# Patient Record
Sex: Female | Born: 1985
Health system: Southern US, Community
[De-identification: ages and names within clinical notes are randomized; demographics above are authoritative.]

## PROBLEM LIST (undated history)

## (undated) ENCOUNTER — Inpatient Hospital Stay (HOSPITAL_COMMUNITY): Admission: RE | Payer: 59 | Source: Ambulatory Visit

## (undated) DIAGNOSIS — E0500 Thyrotoxicosis with diffuse goiter without thyrotoxic crisis or storm: Secondary | ICD-10-CM

## (undated) DIAGNOSIS — E039 Hypothyroidism, unspecified: Secondary | ICD-10-CM

## (undated) HISTORY — PX: OTHER SURGICAL HISTORY: SHX169

## (undated) HISTORY — PX: LAPAROSCOPY: SHX197

## (undated) HISTORY — DX: Hypothyroidism, unspecified: E03.9

## (undated) HISTORY — PX: FACIAL COSMETIC SURGERY: SHX629

---
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
## 2008-06-07 HISTORY — PX: THYROIDECTOMY: SHX17

## 2010-01-23 ENCOUNTER — Ambulatory Visit: Payer: Self-pay | Admitting: Psychology

## 2010-02-04 ENCOUNTER — Ambulatory Visit: Payer: Self-pay | Admitting: Psychology

## 2010-02-24 ENCOUNTER — Ambulatory Visit: Payer: Self-pay | Admitting: Psychology

## 2010-03-03 ENCOUNTER — Ambulatory Visit: Admission: RE | Admit: 2010-03-03 | Discharge: 2010-03-03 | Payer: Self-pay | Admitting: Rheumatology

## 2010-03-05 ENCOUNTER — Emergency Department (HOSPITAL_COMMUNITY): Admission: EM | Admit: 2010-03-05 | Discharge: 2010-03-06 | Payer: Self-pay | Admitting: Emergency Medicine

## 2010-03-05 ENCOUNTER — Emergency Department (HOSPITAL_COMMUNITY)
Admission: EM | Admit: 2010-03-05 | Discharge: 2010-03-05 | Payer: Self-pay | Source: Home / Self Care | Admitting: Emergency Medicine

## 2010-03-25 ENCOUNTER — Ambulatory Visit: Payer: Self-pay | Admitting: Psychology

## 2010-04-08 ENCOUNTER — Ambulatory Visit: Payer: Self-pay | Admitting: Psychology

## 2010-04-23 ENCOUNTER — Ambulatory Visit: Payer: Self-pay | Admitting: Psychology

## 2010-06-03 ENCOUNTER — Ambulatory Visit: Payer: Self-pay | Admitting: Psychology

## 2010-06-26 ENCOUNTER — Encounter (INDEPENDENT_AMBULATORY_CARE_PROVIDER_SITE_OTHER): Payer: Self-pay | Admitting: Surgery

## 2010-06-26 ENCOUNTER — Ambulatory Visit (HOSPITAL_COMMUNITY)
Admission: RE | Admit: 2010-06-26 | Discharge: 2010-06-27 | Payer: Self-pay | Source: Home / Self Care | Attending: Surgery | Admitting: Surgery

## 2010-06-27 ENCOUNTER — Encounter: Payer: Self-pay | Admitting: Endocrinology

## 2010-06-28 NOTE — Discharge Summary (Signed)
  NAMEAVERLY, Melinda Zimmerman                ACCOUNT NO.:  [id number]  MEDICAL RECORD NO.:  [id number]          PATIENT TYPE:  OIB  LOCATION:  1523                         FACILITY:  [facility]  PHYSICIAN:  Velora Heckler, MD      DATE OF BIRTH:  1986/04/07  DATE OF ADMISSION:  06/26/2010 DATE OF DISCHARGE:  06/27/2010                              DISCHARGE SUMMARY   REASON FOR ADMISSION:  Graves disease.  BRIEF HISTORY:  The patient is a 25 year old white female from [place], [place].  She was diagnosed in September 2011 with Graves disease.  She was seen and evaluated by Dr. Talmage Coin.  She was started on methimazole and atenolol.  She was switched to propranolol and steroids were added.  The patient now comes to surgery for thyroidectomy for management of Graves disease.  HOSPITAL COURSE:  The patient was admitted on June 26, 2010.  She underwent total thyroidectomy without complication.  Postoperatively, her calcium levels remained stable at 8.9 and 9.2.  The patient tolerated a soft liquid diet easily.  She was prepared for discharge home on the first postoperative day.  DISCHARGE/PLAN:  The patient was discharged home today, June 27, 2010, in good condition, tolerating a liquid diet, and ambulating independently.  Discharge medications include Lortab elixir for pain. The patient will be seen back in my office at [facility] in 2 to 3 weeks for wound check.  The patient has a followup appointment scheduled in 1 week with Dr. Talmage Coin for laboratory work and for initiation of thyroid hormone replacement.  FINAL DIAGNOSIS:  Graves disease, final pathology, results pending at the time of discharge.  CONDITION AT DISCHARGE:  Good.     Velora Heckler, MD     TMG/MEDQ  D:  06/27/2010  T:  06/27/2010  Job:  [id number]  cc:   Tonita Cong, M.D.  Dr. Shary Decamp  Electronically Signed by Darnell Level MD on 06/28/2010 10:05:04 AM

## 2010-06-28 NOTE — Op Note (Signed)
Melinda Zimmerman, Melinda Zimmerman                ACCOUNT NO.:  [id number]  MEDICAL RECORD NO.:  [id number]          PATIENT TYPE:  AMB  LOCATION:  DAY                          FACILITY:  [facility]  PHYSICIAN:  Velora Heckler, MD      DATE OF BIRTH:  Dec 11, 1985  DATE OF PROCEDURE:  06/26/2010 DATE OF DISCHARGE:                              OPERATIVE REPORT   PREOPERATIVE DIAGNOSIS:  Graves' disease.  POSTOPERATIVE DIAGNOSIS:  Graves' disease.  PROCEDURE:  Total thyroidectomy.  SURGEON:  Velora Heckler, MD  ANESTHESIA:  General per Hezzie Bump. Okey Dupre, MD  ESTIMATED BLOOD LOSS:  Minimal.  PREPARATION:  ChloraPrep.  COMPLICATIONS:  None.  INDICATIONS FOR PROCEDURE:  The patient is a 25 year old white female from [place], [place].  She was diagnosed in September 2011 with Graves' disease.  She was treated by Dr. Talmage Coin in preparation for surgery with methimazole and beta blockade.  She now comes for surgery, for thyroidectomy, for management of hyperthyroidism.  PROCEDURE IN DETAIL:  Procedure was done in OR #11 at the [facility]  The patient was brought to the operating room, placed in supine position on the operating room table.  Following administration of general anesthesia, the patient was positioned and then prepped and draped in the usual strict aseptic fashion.  After ascertaining that an adequate level of anesthesia had been achieved, a small Kocher incision was made in the anterior neck with #15 blade. Dissection was carried through subcutaneous tissues and platysma. Hemostasis was obtained with electrocautery.  Skin flaps were elevated cephalad and caudad from the thyroid notch to the sternal notch.  A Mahorner self-retaining retractor was placed for exposure.  Strap muscles were incised in the midline.  Dissection was begun on the left. Left lobe was mildly enlarged.  There are no discrete or dominant nodules palpable.  Lobe was gently mobilized  with blunt dissection and venous tributaries divided between Ligaclips with the harmonic scalpel. Superior pole is relatively high.  Vessels were divided individually between Ligaclips with the harmonic scalpel.  Care was taken to avoid parathyroid tissue.  The inferior venous tributaries were divided between medium Ligaclips with the harmonic scalpel.  Parathyroid tissue was identified and preserved on its vascular pedicle.  Branches of the inferior thyroid artery were divided between small Ligaclips.  Recurrent nerve was identified and preserved.  Ligament of Allyson Sabal was released with electrocautery and the gland was mobilized up and onto the anterior trachea.  There was a moderate-sized pyramidal lobe which was dissected off the anterior thyroid cartilage with the electrocautery used for hemostasis.  It was resected en bloc with the thyroid isthmus.  Isthmus was mobilized across the midline with electrocautery.  Dry pack was placed in the left neck.  Next, we turned our attention to the right thyroid lobe.  Strap muscles were again reflected laterally and venous tributaries divided between medium Ligaclips with the harmonic scalpel.  Gland was gently mobilized. Again the right lobe was larger than normal and the superior pole does extend quite cephalad.  Superior pole vessels were divided individually between medium Ligaclips with the harmonic  scalpel.  Gland is rolled anteriorly and parathyroid tissue was identified and preserved. Branches of the inferior thyroid artery are divided between small Ligaclips with the harmonic scalpel.  Inferior venous tributaries were divided between medium Ligaclips with the harmonic scalpel.  Recurrent nerve was identified and preserved.  The remaining branches of the inferior thyroid artery were divided between small Ligaclips and the ligament of Allyson Sabal was released with electrocautery.  The gland was excised off the anterior trachea.  The gland was  marked with a suture in the right superior pole and the entire thyroid was submitted to pathology for review.  Neck was irrigated with warm saline.  Good hemostasis was achieved bilaterally.  Surgicel was placed in the operative field.  Strap muscles were reapproximated in the midline with interrupted 3-0 Vicryl sutures. Platysma was closed with interrupted 3-0 Vicryl sutures.  Skin was closed with running 4-0 Monocryl subcuticular suture.  Wound was washed and dried and Benzoin and Steri-Strips were applied.  Sterile dressings were applied.  The patient was awakened from anesthesia and brought to the recovery room.  The patient tolerated the procedure well.     Velora Heckler, MD     TMG/MEDQ  D:  06/26/2010  T:  06/26/2010  Job:  [id number]  cc:   Tonita Cong, M.D.  Etheleen Mayhew, MD [facility] [address] [place], [place] [address]  Electronically Signed by Darnell Level MD on 06/28/2010 10:04:59 AM

## 2010-06-29 LAB — URINALYSIS, ROUTINE W REFLEX MICROSCOPIC
Hgb urine dipstick: NEGATIVE
Ketones, ur: NEGATIVE mg/dL
Protein, ur: NEGATIVE mg/dL
Urine Glucose, Fasting: NEGATIVE mg/dL
Urobilinogen, UA: 0.2 mg/dL (ref 0.0–1.0)

## 2010-06-29 LAB — DIFFERENTIAL
Basophils Absolute: 0 10*3/uL (ref 0.0–0.1)
Eosinophils Absolute: 0 10*3/uL (ref 0.0–0.7)
Eosinophils Relative: 0 % (ref 0–5)
Lymphs Abs: 2.5 10*3/uL (ref 0.7–4.0)
Monocytes Absolute: 0.6 10*3/uL (ref 0.1–1.0)

## 2010-06-29 LAB — BASIC METABOLIC PANEL
BUN: 12 mg/dL (ref 6–23)
CO2: 23 mEq/L (ref 19–32)
Calcium: 9.8 mg/dL (ref 8.4–10.5)
Chloride: 105 mEq/L (ref 96–112)
Creatinine, Ser: 0.37 mg/dL — ABNORMAL LOW (ref 0.4–1.2)
GFR calc Af Amer: >60 mL/min (ref >60)
Glucose, Bld: 99 mg/dL (ref 70–99)

## 2010-06-29 LAB — CBC
HCT: 43.9 % (ref 36.0–46.0)
MCHC: 34.2 g/dL (ref 30.0–36.0)
MCV: 77.8 fL — ABNORMAL LOW (ref 78.0–100.0)
Platelets: 233 10*3/uL (ref 150–400)
RDW: 13.5 % (ref 11.5–15.5)
WBC: 9.2 10*3/uL (ref 4.0–10.5)

## 2010-06-29 LAB — SURGICAL PCR SCREEN
MRSA, PCR: NEGATIVE
Staphylococcus aureus: POSITIVE — AB

## 2010-06-29 LAB — CALCIUM: Calcium: 8.9 mg/dL (ref 8.4–10.5)

## 2010-06-30 LAB — CALCIUM: Calcium: 9.2 mg/dL (ref 8.4–10.5)

## 2010-08-20 LAB — CBC
HCT: 38.5 % (ref 36.0–46.0)
Hemoglobin: 13.2 g/dL (ref 12.0–15.0)
MCH: 26.5 pg (ref 26.0–34.0)
MCHC: 34.3 g/dL (ref 30.0–36.0)
MCV: 77.3 fL — ABNORMAL LOW (ref 78.0–100.0)
Platelets: 179 10*3/uL (ref 150–400)
RBC: 4.98 MIL/uL (ref 3.87–5.11)
RDW: 13.3 % (ref 11.5–15.5)
WBC: 5.2 10*3/uL (ref 4.0–10.5)

## 2010-08-20 LAB — RAPID STREP SCREEN (MED CTR MEBANE ONLY): Streptococcus, Group A Screen (Direct): NEGATIVE

## 2010-08-20 LAB — POCT URINALYSIS DIPSTICK
Glucose, UA: NEGATIVE mg/dL
Nitrite: NEGATIVE
Specific Gravity, Urine: >=1.03 (ref 1.005–1.030)
Urobilinogen, UA: 1 mg/dL (ref 0.0–1.0)

## 2010-08-20 LAB — DIFFERENTIAL
Basophils Absolute: 0 10*3/uL (ref 0.0–0.1)
Basophils Relative: 0 % (ref 0–1)
Eosinophils Absolute: 0.1 K/uL (ref 0.0–0.7)
Eosinophils Relative: 1 % (ref 0–5)
Lymphocytes Relative: 69 % — ABNORMAL HIGH (ref 12–46)
Lymphs Abs: 3.6 K/uL (ref 0.7–4.0)
Monocytes Absolute: 0.5 K/uL (ref 0.1–1.0)
Monocytes Relative: 9 % (ref 3–12)
Neutro Abs: 1.1 10*3/uL — ABNORMAL LOW (ref 1.7–7.7)
Neutrophils Relative %: 21 % — ABNORMAL LOW (ref 43–77)

## 2010-08-20 LAB — BASIC METABOLIC PANEL WITH GFR
CO2: 24 meq/L (ref 19–32)
Chloride: 112 meq/L (ref 96–112)
Glucose, Bld: 98 mg/dL (ref 70–99)
Potassium: 3.9 meq/L (ref 3.5–5.1)
Sodium: 139 meq/L (ref 135–145)

## 2010-08-20 LAB — BASIC METABOLIC PANEL
BUN: 5 mg/dL — ABNORMAL LOW (ref 6–23)
Calcium: 9.1 mg/dL (ref 8.4–10.5)
Creatinine, Ser: <0.3 mg/dL — ABNORMAL LOW (ref 0.4–1.2)

## 2010-08-20 LAB — POCT PREGNANCY, URINE: Preg Test, Ur: NEGATIVE

## 2010-08-20 LAB — T4, FREE: Free T4: 4.01 ng/dL — ABNORMAL HIGH (ref 0.80–1.80)

## 2010-08-20 LAB — TSH: TSH: 0.008 u[IU]/mL — ABNORMAL LOW (ref 0.350–4.500)

## 2010-09-02 ENCOUNTER — Ambulatory Visit: Payer: Self-pay | Admitting: Psychology

## 2010-09-09 ENCOUNTER — Ambulatory Visit (INDEPENDENT_AMBULATORY_CARE_PROVIDER_SITE_OTHER): Payer: BC Managed Care – PPO | Admitting: Psychology

## 2010-09-09 DIAGNOSIS — F411 Generalized anxiety disorder: Secondary | ICD-10-CM

## 2010-09-22 ENCOUNTER — Ambulatory Visit (INDEPENDENT_AMBULATORY_CARE_PROVIDER_SITE_OTHER): Payer: BC Managed Care – PPO | Admitting: Psychology

## 2010-09-22 DIAGNOSIS — F411 Generalized anxiety disorder: Secondary | ICD-10-CM

## 2010-09-30 ENCOUNTER — Ambulatory Visit (INDEPENDENT_AMBULATORY_CARE_PROVIDER_SITE_OTHER): Payer: BC Managed Care – PPO | Admitting: Psychology

## 2010-09-30 DIAGNOSIS — F411 Generalized anxiety disorder: Secondary | ICD-10-CM

## 2010-10-07 ENCOUNTER — Ambulatory Visit: Payer: BC Managed Care – PPO | Admitting: Psychology

## 2010-10-13 ENCOUNTER — Ambulatory Visit (INDEPENDENT_AMBULATORY_CARE_PROVIDER_SITE_OTHER): Payer: BC Managed Care – PPO | Admitting: Psychology

## 2010-10-13 DIAGNOSIS — F411 Generalized anxiety disorder: Secondary | ICD-10-CM

## 2010-10-20 ENCOUNTER — Ambulatory Visit (INDEPENDENT_AMBULATORY_CARE_PROVIDER_SITE_OTHER): Payer: BC Managed Care – PPO | Admitting: Psychology

## 2010-10-20 DIAGNOSIS — F411 Generalized anxiety disorder: Secondary | ICD-10-CM

## 2010-10-28 ENCOUNTER — Ambulatory Visit (INDEPENDENT_AMBULATORY_CARE_PROVIDER_SITE_OTHER): Payer: BC Managed Care – PPO | Admitting: Psychology

## 2010-10-28 DIAGNOSIS — F411 Generalized anxiety disorder: Secondary | ICD-10-CM

## 2010-11-04 ENCOUNTER — Ambulatory Visit (INDEPENDENT_AMBULATORY_CARE_PROVIDER_SITE_OTHER): Payer: BC Managed Care – PPO | Admitting: Psychology

## 2010-11-04 DIAGNOSIS — F411 Generalized anxiety disorder: Secondary | ICD-10-CM

## 2010-11-11 ENCOUNTER — Ambulatory Visit: Payer: BC Managed Care – PPO | Admitting: Psychology

## 2010-11-17 ENCOUNTER — Ambulatory Visit: Payer: BC Managed Care – PPO | Admitting: Psychology

## 2010-11-24 ENCOUNTER — Ambulatory Visit (INDEPENDENT_AMBULATORY_CARE_PROVIDER_SITE_OTHER): Payer: BC Managed Care – PPO | Admitting: Psychology

## 2010-11-24 DIAGNOSIS — F411 Generalized anxiety disorder: Secondary | ICD-10-CM

## 2010-11-30 ENCOUNTER — Ambulatory Visit (INDEPENDENT_AMBULATORY_CARE_PROVIDER_SITE_OTHER): Payer: BC Managed Care – PPO | Admitting: Psychology

## 2010-11-30 DIAGNOSIS — F411 Generalized anxiety disorder: Secondary | ICD-10-CM

## 2011-07-14 IMAGING — CR DG CHEST 2V
2 series · 2 of 2 positions shown · non-contrast
Comparison: None.

CLINICAL DATA: Preoperative respiratory exam.  Thyroid nodules.

CHEST - 2 VIEW

[w chest pa]
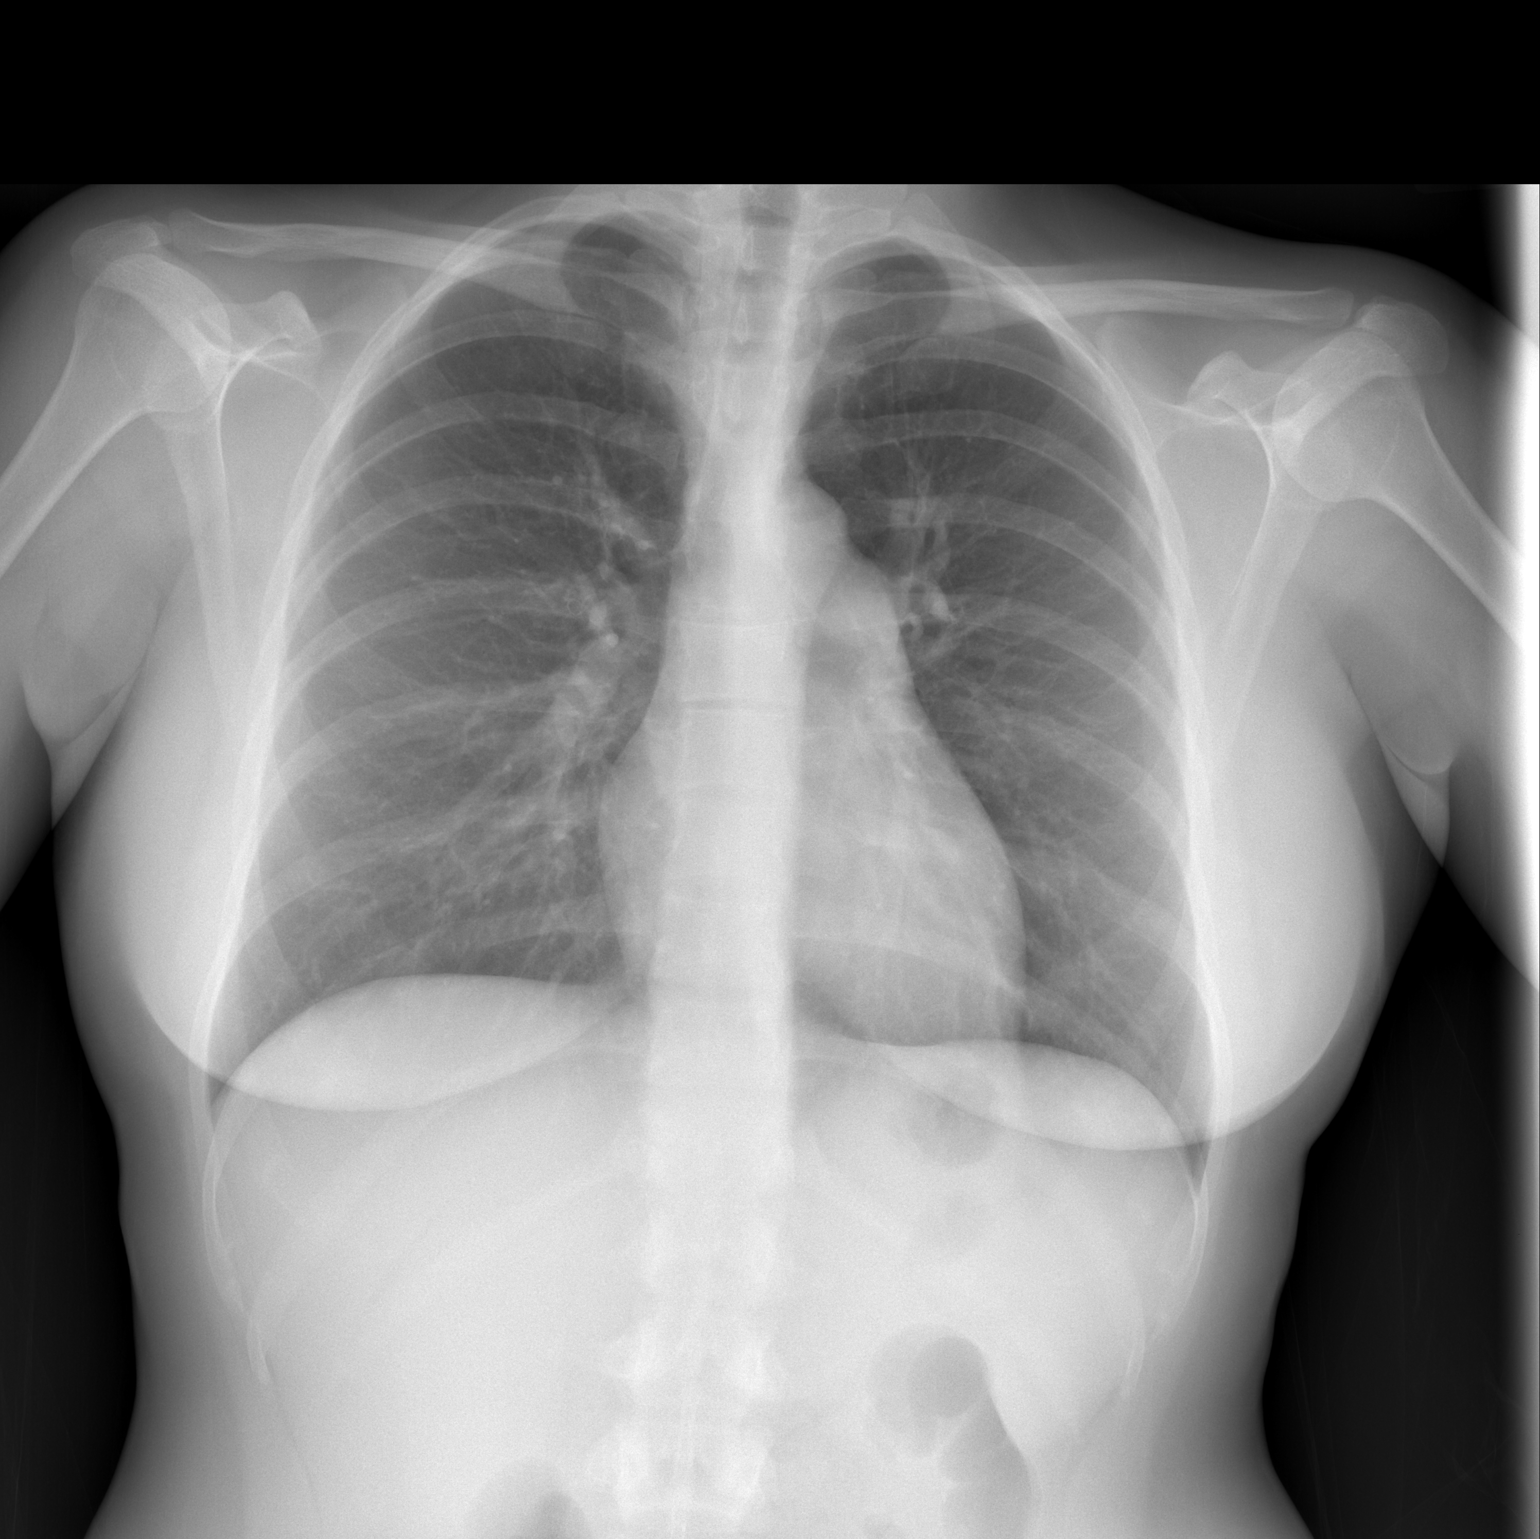

[w chest lat]
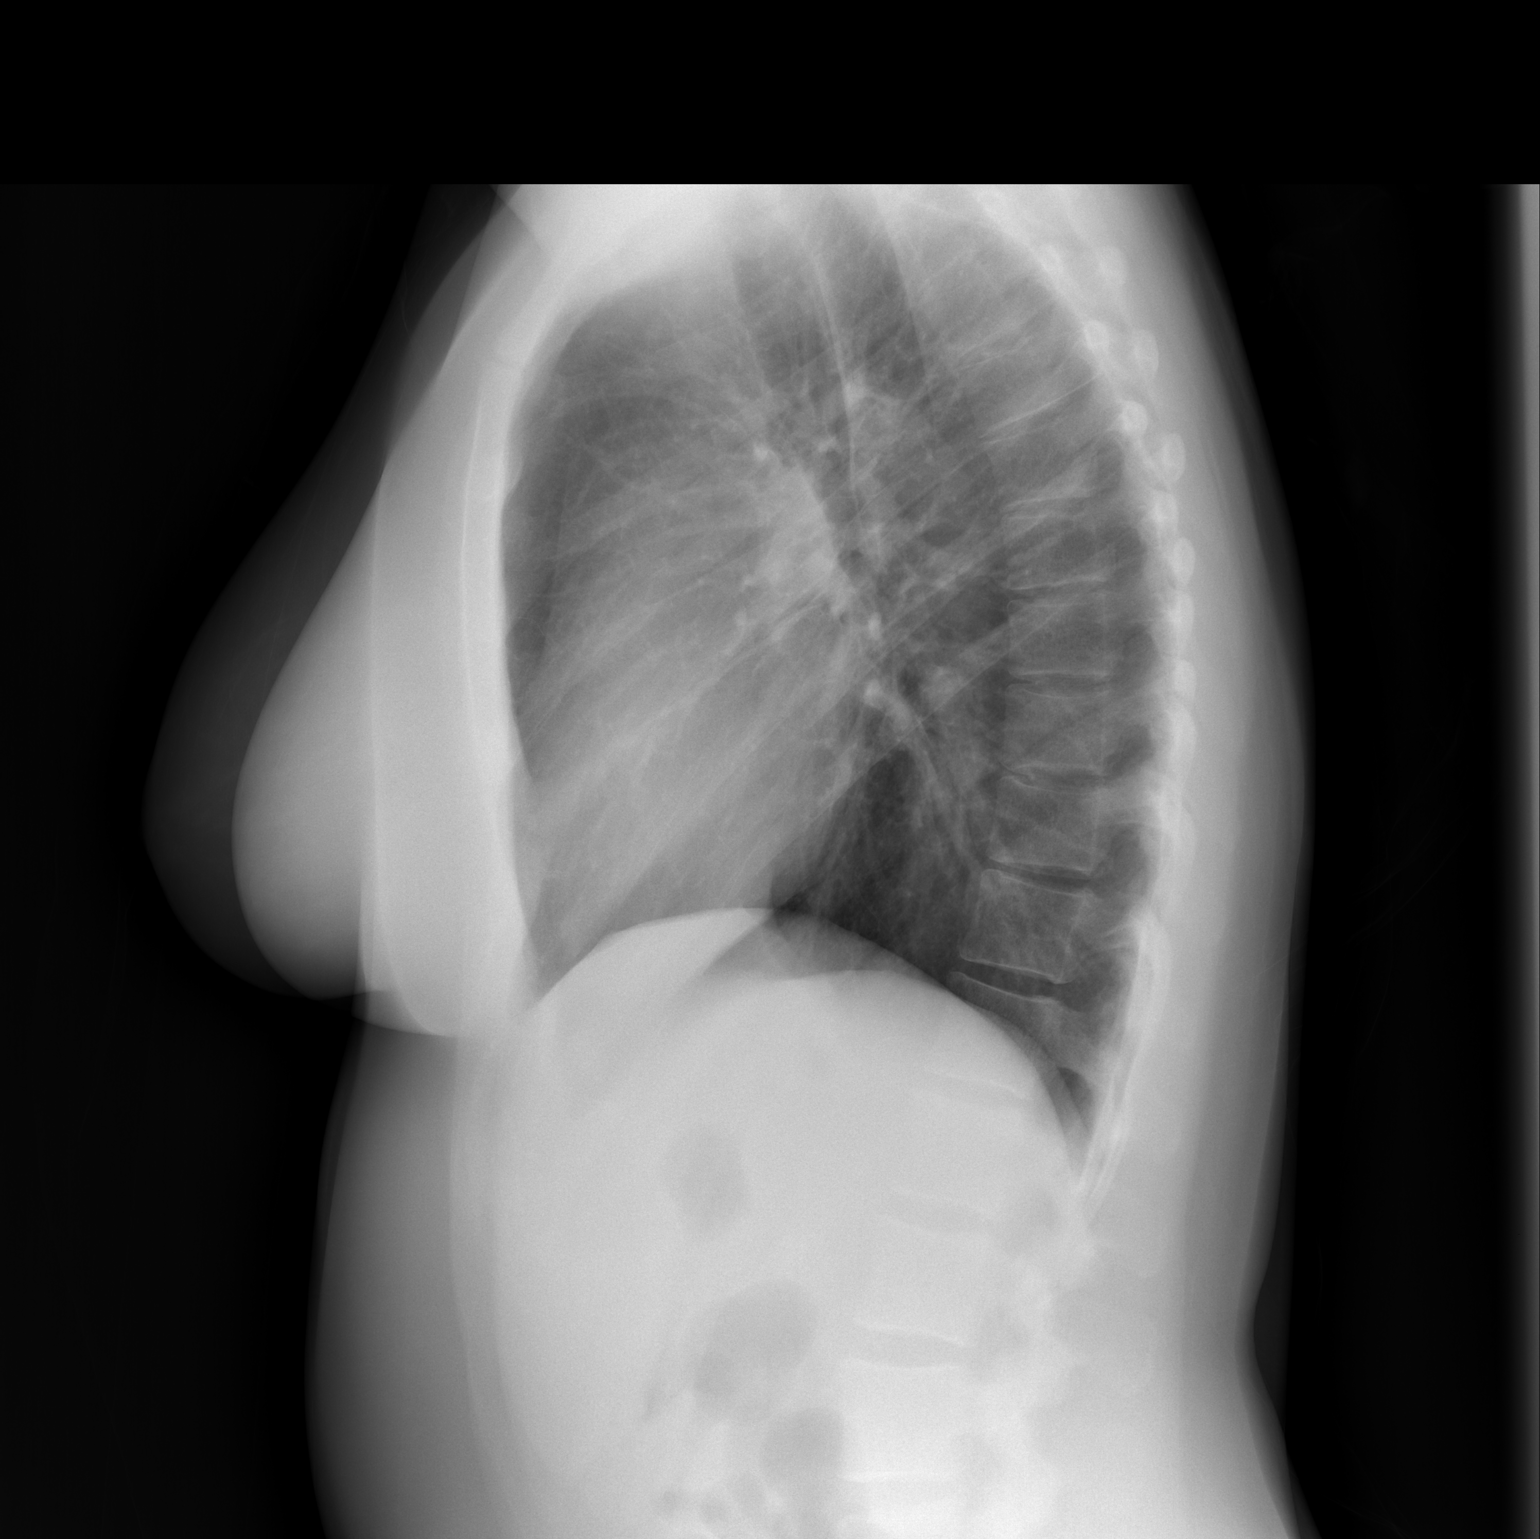

[2 of 2 positions shown; findings below may reference images not displayed]

FINDINGS: The heart size and vascularity are normal and the lungs
are clear.  No osseous abnormality.
IMPRESSION: Normal chest.

## 2012-01-11 ENCOUNTER — Other Ambulatory Visit: Payer: Self-pay | Admitting: Gynecology

## 2014-01-09 ENCOUNTER — Other Ambulatory Visit: Payer: Self-pay | Admitting: Gynecology

## 2014-01-11 LAB — CYTOLOGY - PAP

## 2015-02-27 ENCOUNTER — Other Ambulatory Visit: Payer: Self-pay | Admitting: Gynecology

## 2015-03-03 LAB — CYTOLOGY - PAP

## 2018-03-02 MED FILL — LIOTHYRONINE SODIUM 5 MCG T: 5 | 30 days supply | Qty: 120 | Fill #0

## 2018-03-07 DIAGNOSIS — N926 Irregular menstruation, unspecified: Secondary | ICD-10-CM | POA: Diagnosis not present

## 2018-03-07 DIAGNOSIS — Z01419 Encounter for gynecological examination (general) (routine) without abnormal findings: Secondary | ICD-10-CM | POA: Diagnosis not present

## 2018-04-04 DIAGNOSIS — L940 Localized scleroderma [morphea]: Secondary | ICD-10-CM | POA: Diagnosis not present

## 2018-04-04 DIAGNOSIS — E890 Postprocedural hypothyroidism: Secondary | ICD-10-CM | POA: Diagnosis not present

## 2018-04-04 DIAGNOSIS — Z6820 Body mass index (BMI) 20.0-20.9, adult: Secondary | ICD-10-CM | POA: Diagnosis not present

## 2018-04-11 MED FILL — LEVOTHYROXINE 88 MCG TABLET: 88 | 90 days supply | Qty: 90 | Fill #0

## 2018-04-24 DIAGNOSIS — Z3141 Encounter for fertility testing: Secondary | ICD-10-CM | POA: Diagnosis not present

## 2018-04-24 MED FILL — LIOTHYRONINE SODIUM 5 MCG T: 5 | 30 days supply | Qty: 120 | Fill #1

## 2018-04-24 MED FILL — DOXYCYCLINE HYCLATE 100 MG: 100 | 5 days supply | Qty: 10 | Fill #0

## 2018-05-16 DIAGNOSIS — N925 Other specified irregular menstruation: Secondary | ICD-10-CM | POA: Diagnosis not present

## 2018-05-29 MED FILL — LIOTHYRONINE SODIUM 5 MCG T: 5 | 30 days supply | Qty: 120 | Fill #0

## 2018-06-26 MED FILL — LIOTHYRONINE SODIUM 5 MCG T: 5 | 30 days supply | Qty: 120 | Fill #1

## 2018-07-04 DIAGNOSIS — Z319 Encounter for procreative management, unspecified: Secondary | ICD-10-CM | POA: Diagnosis not present

## 2018-07-04 DIAGNOSIS — E288 Other ovarian dysfunction: Secondary | ICD-10-CM | POA: Diagnosis not present

## 2018-07-04 DIAGNOSIS — Z3141 Encounter for fertility testing: Secondary | ICD-10-CM | POA: Diagnosis not present

## 2018-07-06 MED FILL — LEVOTHYROXINE 88 MCG TABLET: 88 | 90 days supply | Qty: 90 | Fill #1

## 2018-07-12 MED FILL — OVIDREL 250 MCG/0.5 ML SYRG: 250 | 14 days supply | Qty: 1 | Fill #0

## 2018-07-12 MED FILL — LETROZOLE 2.5 MG TABLET: 2.5 | 7 days supply | Qty: 5 | Fill #0

## 2018-07-26 MED FILL — LIOTHYRONINE SODIUM 5 MCG T: 5 | 30 days supply | Qty: 120 | Fill #2

## 2018-07-28 DIAGNOSIS — Z319 Encounter for procreative management, unspecified: Secondary | ICD-10-CM | POA: Diagnosis not present

## 2018-08-04 DIAGNOSIS — Z319 Encounter for procreative management, unspecified: Secondary | ICD-10-CM | POA: Diagnosis not present

## 2018-08-15 MED FILL — OVIDREL 250 MCG/0.5 ML SYRG: 250 | 14 days supply | Qty: 1 | Fill #1

## 2018-08-15 MED FILL — LETROZOLE 2.5 MG TABLET: 2.5 | 7 days supply | Qty: 5 | Fill #1

## 2018-08-27 MED FILL — LIOTHYRONINE SODIUM 5 MCG T: 5 | 30 days supply | Qty: 120 | Fill #3

## 2018-08-28 DIAGNOSIS — Z3189 Encounter for other procreative management: Secondary | ICD-10-CM | POA: Diagnosis not present

## 2018-08-29 DIAGNOSIS — Z3189 Encounter for other procreative management: Secondary | ICD-10-CM | POA: Diagnosis not present

## 2018-09-15 MED FILL — LIOTHYRONINE SODIUM 5 MCG T: 5 | 30 days supply | Qty: 120 | Fill #4

## 2018-09-21 MED FILL — LEVOTHYROXINE 88 MCG TABLET: 88 | 90 days supply | Qty: 90 | Fill #0

## 2018-09-25 DIAGNOSIS — N8500 Endometrial hyperplasia, unspecified: Secondary | ICD-10-CM | POA: Diagnosis not present

## 2018-09-25 DIAGNOSIS — Z3141 Encounter for fertility testing: Secondary | ICD-10-CM | POA: Diagnosis not present

## 2018-09-25 DIAGNOSIS — Z3183 Encounter for assisted reproductive fertility procedure cycle: Secondary | ICD-10-CM | POA: Diagnosis not present

## 2018-09-25 MED FILL — DOXYCYCLINE HYCLATE 100 MG: 100 | 5 days supply | Qty: 10 | Fill #0

## 2018-09-26 MED FILL — BD 3 ML SYRINGE WITH NEEDLE: 22G X 1-1/2 | 20 days supply | Qty: 20 | Fill #0

## 2018-09-26 MED FILL — SHARPS COLLECTOR 1.4QT: 30 days supply | Qty: 1 | Fill #0

## 2018-09-26 MED FILL — PROGESTERONE OIL 50 MG/ML V: 50 | 30 days supply | Qty: 30 | Fill #0

## 2018-09-26 MED FILL — BD NEEDLES 27GX0.5": 27G X 1/2" | 30 days supply | Qty: 30 | Fill #0

## 2018-09-26 MED FILL — BD 3 ML SYRINGE 18GX1-1/2": 18G X 1-1/2 | 50 days supply | Qty: 50 | Fill #0

## 2018-09-26 MED FILL — GONAL-F 1,050 UNITS VIAL: 1050 | 30 days supply | Qty: 2 | Fill #0

## 2018-09-26 MED FILL — MENOPUR 75 UNIT VIAL: 75 | 10 days supply | Qty: 10 | Fill #0

## 2018-09-26 MED FILL — ULTICARE ALCOHOL SWABS PADS: 30 days supply | Qty: 100 | Fill #0

## 2018-09-26 MED FILL — BD 3 ML SYRINGE 18GX1-1/2: 18G X 1-1/2 | 50 days supply | Qty: 50 | Fill #0

## 2018-09-26 MED FILL — DOTTI 0.1 MG/24HR PTTW: 0.1 | 28 days supply | Qty: 8 | Fill #0

## 2018-09-26 MED FILL — ESTRADIOL 2 MG TABS: 2 | 30 days supply | Qty: 60 | Fill #0

## 2018-09-26 MED FILL — BD NEEDLES 27GX0.5: 27G X 1/2" | 30 days supply | Qty: 30 | Fill #0

## 2018-09-26 MED FILL — METHYLPREDNISOLONE 4 MG TAB: 4 | 4 days supply | Qty: 16 | Fill #0

## 2018-09-26 MED FILL — PREGNYL 10,000 UNITS VIAL: 10000 | 30 days supply | Qty: 1 | Fill #0

## 2018-10-02 DIAGNOSIS — F4323 Adjustment disorder with mixed anxiety and depressed mood: Secondary | ICD-10-CM | POA: Diagnosis not present

## 2018-10-03 MED FILL — LEUPROLIDE 2WK 1 MG/0.2 ML: 1 | 14 days supply | Qty: 1 | Fill #0

## 2018-10-04 MED FILL — DOXYCYCLINE HYCLATE 100 MG: 100 | 20 days supply | Qty: 40 | Fill #0

## 2018-10-05 MED FILL — MENOPUR 75 UNIT VIAL: 75 | 10 days supply | Qty: 10 | Fill #1

## 2018-10-06 HISTORY — PX: SALPINGECTOMY: SHX328

## 2018-10-12 DIAGNOSIS — F4323 Adjustment disorder with mixed anxiety and depressed mood: Secondary | ICD-10-CM | POA: Diagnosis not present

## 2018-10-16 DIAGNOSIS — Z3200 Encounter for pregnancy test, result unknown: Secondary | ICD-10-CM | POA: Diagnosis not present

## 2018-10-17 DIAGNOSIS — E890 Postprocedural hypothyroidism: Secondary | ICD-10-CM | POA: Diagnosis not present

## 2018-10-17 DIAGNOSIS — R5381 Other malaise: Secondary | ICD-10-CM | POA: Diagnosis not present

## 2018-10-18 DIAGNOSIS — Z331 Pregnant state, incidental: Secondary | ICD-10-CM | POA: Diagnosis not present

## 2018-10-18 DIAGNOSIS — Z3200 Encounter for pregnancy test, result unknown: Secondary | ICD-10-CM | POA: Diagnosis not present

## 2018-10-18 DIAGNOSIS — E890 Postprocedural hypothyroidism: Secondary | ICD-10-CM | POA: Diagnosis not present

## 2018-10-18 DIAGNOSIS — L940 Localized scleroderma [morphea]: Secondary | ICD-10-CM | POA: Diagnosis not present

## 2018-10-19 DIAGNOSIS — F4323 Adjustment disorder with mixed anxiety and depressed mood: Secondary | ICD-10-CM | POA: Diagnosis not present

## 2018-10-20 MED FILL — DOTTI 0.1 MG/24HR PTTW: 0.1 | 28 days supply | Qty: 8 | Fill #0

## 2018-10-21 MED FILL — LIOTHYRONINE SODIUM 5 MCG T: 5 | 30 days supply | Qty: 120 | Fill #5

## 2018-10-23 DIAGNOSIS — O200 Threatened abortion: Secondary | ICD-10-CM | POA: Diagnosis not present

## 2018-10-24 DIAGNOSIS — O200 Threatened abortion: Secondary | ICD-10-CM | POA: Diagnosis not present

## 2018-10-26 DIAGNOSIS — F4323 Adjustment disorder with mixed anxiety and depressed mood: Secondary | ICD-10-CM | POA: Diagnosis not present

## 2018-10-30 DIAGNOSIS — O00101 Right tubal pregnancy without intrauterine pregnancy: Secondary | ICD-10-CM | POA: Diagnosis not present

## 2018-10-30 DIAGNOSIS — O200 Threatened abortion: Secondary | ICD-10-CM | POA: Diagnosis not present

## 2018-11-02 DIAGNOSIS — O200 Threatened abortion: Secondary | ICD-10-CM | POA: Diagnosis not present

## 2018-11-02 DIAGNOSIS — O021 Missed abortion: Secondary | ICD-10-CM | POA: Diagnosis not present

## 2018-11-02 DIAGNOSIS — F4323 Adjustment disorder with mixed anxiety and depressed mood: Secondary | ICD-10-CM | POA: Diagnosis not present

## 2018-11-04 DIAGNOSIS — O00101 Right tubal pregnancy without intrauterine pregnancy: Secondary | ICD-10-CM | POA: Diagnosis not present

## 2018-11-04 DIAGNOSIS — O021 Missed abortion: Secondary | ICD-10-CM | POA: Diagnosis not present

## 2018-11-04 DIAGNOSIS — O200 Threatened abortion: Secondary | ICD-10-CM | POA: Diagnosis not present

## 2018-11-05 DIAGNOSIS — O00101 Right tubal pregnancy without intrauterine pregnancy: Secondary | ICD-10-CM | POA: Diagnosis not present

## 2018-11-05 DIAGNOSIS — O99280 Endocrine, nutritional and metabolic diseases complicating pregnancy, unspecified trimester: Secondary | ICD-10-CM | POA: Diagnosis not present

## 2018-11-05 DIAGNOSIS — N7011 Chronic salpingitis: Secondary | ICD-10-CM | POA: Diagnosis not present

## 2018-11-05 DIAGNOSIS — D259 Leiomyoma of uterus, unspecified: Secondary | ICD-10-CM | POA: Diagnosis not present

## 2018-11-05 DIAGNOSIS — Z888 Allergy status to other drugs, medicaments and biological substances status: Secondary | ICD-10-CM | POA: Diagnosis not present

## 2018-11-05 DIAGNOSIS — N7091 Salpingitis, unspecified: Secondary | ICD-10-CM | POA: Diagnosis not present

## 2018-11-05 DIAGNOSIS — E039 Hypothyroidism, unspecified: Secondary | ICD-10-CM | POA: Diagnosis not present

## 2018-11-05 DIAGNOSIS — D252 Subserosal leiomyoma of uterus: Secondary | ICD-10-CM | POA: Diagnosis not present

## 2018-11-05 DIAGNOSIS — O089 Unspecified complication following an ectopic and molar pregnancy: Secondary | ICD-10-CM | POA: Diagnosis not present

## 2018-11-05 DIAGNOSIS — O3410 Maternal care for benign tumor of corpus uteri, unspecified trimester: Secondary | ICD-10-CM | POA: Diagnosis not present

## 2018-11-05 DIAGNOSIS — Z3A00 Weeks of gestation of pregnancy not specified: Secondary | ICD-10-CM | POA: Diagnosis not present

## 2018-11-05 DIAGNOSIS — O00102 Left tubal pregnancy without intrauterine pregnancy: Secondary | ICD-10-CM | POA: Diagnosis not present

## 2018-11-05 DIAGNOSIS — Z79899 Other long term (current) drug therapy: Secondary | ICD-10-CM | POA: Diagnosis not present

## 2018-11-08 DIAGNOSIS — N390 Urinary tract infection, site not specified: Secondary | ICD-10-CM | POA: Diagnosis not present

## 2018-11-09 DIAGNOSIS — F4323 Adjustment disorder with mixed anxiety and depressed mood: Secondary | ICD-10-CM | POA: Diagnosis not present

## 2018-11-10 MED FILL — BD 3 ML SYRINGE 18GX1-1/2: 18G X 1-1/2 | 50 days supply | Qty: 50 | Fill #1

## 2018-11-16 DIAGNOSIS — F4323 Adjustment disorder with mixed anxiety and depressed mood: Secondary | ICD-10-CM | POA: Diagnosis not present

## 2018-11-23 DIAGNOSIS — F4323 Adjustment disorder with mixed anxiety and depressed mood: Secondary | ICD-10-CM | POA: Diagnosis not present

## 2018-11-27 MED FILL — LIOTHYRONINE SODIUM 5 MCG T: 5 | 30 days supply | Qty: 120 | Fill #0

## 2018-11-30 DIAGNOSIS — F4323 Adjustment disorder with mixed anxiety and depressed mood: Secondary | ICD-10-CM | POA: Diagnosis not present

## 2018-12-07 DIAGNOSIS — F4323 Adjustment disorder with mixed anxiety and depressed mood: Secondary | ICD-10-CM | POA: Diagnosis not present

## 2018-12-13 MED FILL — LEUPROLIDE 2WK 1 MG/0.2 ML: 1 | 14 days supply | Qty: 1 | Fill #0

## 2018-12-14 MED FILL — LEVOTHYROXINE 88 MCG TABLET: 88 | 90 days supply | Qty: 90 | Fill #1

## 2018-12-18 DIAGNOSIS — Z319 Encounter for procreative management, unspecified: Secondary | ICD-10-CM | POA: Diagnosis not present

## 2018-12-28 DIAGNOSIS — F4323 Adjustment disorder with mixed anxiety and depressed mood: Secondary | ICD-10-CM | POA: Diagnosis not present

## 2019-01-01 MED FILL — LIOTHYRONINE SODIUM 5 MCG T: 5 | 30 days supply | Qty: 120 | Fill #1

## 2019-01-08 DIAGNOSIS — F4323 Adjustment disorder with mixed anxiety and depressed mood: Secondary | ICD-10-CM | POA: Diagnosis not present

## 2019-01-12 DIAGNOSIS — Z3183 Encounter for assisted reproductive fertility procedure cycle: Secondary | ICD-10-CM | POA: Diagnosis not present

## 2019-01-12 DIAGNOSIS — Z113 Encounter for screening for infections with a predominantly sexual mode of transmission: Secondary | ICD-10-CM | POA: Diagnosis not present

## 2019-01-15 DIAGNOSIS — Z3183 Encounter for assisted reproductive fertility procedure cycle: Secondary | ICD-10-CM | POA: Diagnosis not present

## 2019-01-17 DIAGNOSIS — Z113 Encounter for screening for infections with a predominantly sexual mode of transmission: Secondary | ICD-10-CM | POA: Diagnosis not present

## 2019-01-17 DIAGNOSIS — Z3183 Encounter for assisted reproductive fertility procedure cycle: Secondary | ICD-10-CM | POA: Diagnosis not present

## 2019-01-19 DIAGNOSIS — Z113 Encounter for screening for infections with a predominantly sexual mode of transmission: Secondary | ICD-10-CM | POA: Diagnosis not present

## 2019-01-19 DIAGNOSIS — Z3183 Encounter for assisted reproductive fertility procedure cycle: Secondary | ICD-10-CM | POA: Diagnosis not present

## 2019-01-19 DIAGNOSIS — F4323 Adjustment disorder with mixed anxiety and depressed mood: Secondary | ICD-10-CM | POA: Diagnosis not present

## 2019-01-20 DIAGNOSIS — Z3183 Encounter for assisted reproductive fertility procedure cycle: Secondary | ICD-10-CM | POA: Diagnosis not present

## 2019-01-20 DIAGNOSIS — Z113 Encounter for screening for infections with a predominantly sexual mode of transmission: Secondary | ICD-10-CM | POA: Diagnosis not present

## 2019-01-21 DIAGNOSIS — Z3183 Encounter for assisted reproductive fertility procedure cycle: Secondary | ICD-10-CM | POA: Diagnosis not present

## 2019-01-21 DIAGNOSIS — Z113 Encounter for screening for infections with a predominantly sexual mode of transmission: Secondary | ICD-10-CM | POA: Diagnosis not present

## 2019-01-23 DIAGNOSIS — Z3183 Encounter for assisted reproductive fertility procedure cycle: Secondary | ICD-10-CM | POA: Diagnosis not present

## 2019-01-23 MED FILL — PROMETHAZINE 12.5 MG TABLET: 12.5 | 3 days supply | Qty: 15 | Fill #0

## 2019-01-23 MED FILL — ONDANSETRON HCL 4 MG TABLET: 4 | 4 days supply | Qty: 20 | Fill #0

## 2019-01-23 MED FILL — CABERGOLINE 0.5 MG TABLET: 0.5 | 8 days supply | Qty: 8 | Fill #0

## 2019-01-23 MED FILL — traMADol HCL 50 MG TABS: 50 | 2 days supply | Qty: 8 | Fill #0

## 2019-01-25 DIAGNOSIS — F4323 Adjustment disorder with mixed anxiety and depressed mood: Secondary | ICD-10-CM | POA: Diagnosis not present

## 2019-01-28 DIAGNOSIS — Z3183 Encounter for assisted reproductive fertility procedure cycle: Secondary | ICD-10-CM | POA: Diagnosis not present

## 2019-01-29 DIAGNOSIS — Z3183 Encounter for assisted reproductive fertility procedure cycle: Secondary | ICD-10-CM | POA: Diagnosis not present

## 2019-02-01 DIAGNOSIS — F4323 Adjustment disorder with mixed anxiety and depressed mood: Secondary | ICD-10-CM | POA: Diagnosis not present

## 2019-02-01 MED FILL — LIOTHYRONINE SODIUM 5 MCG T: 5 | 30 days supply | Qty: 120 | Fill #2

## 2019-02-05 DIAGNOSIS — Z3183 Encounter for assisted reproductive fertility procedure cycle: Secondary | ICD-10-CM | POA: Diagnosis not present

## 2019-02-08 DIAGNOSIS — F4323 Adjustment disorder with mixed anxiety and depressed mood: Secondary | ICD-10-CM | POA: Diagnosis not present

## 2019-02-10 DIAGNOSIS — Z3141 Encounter for fertility testing: Secondary | ICD-10-CM | POA: Diagnosis not present

## 2019-02-10 DIAGNOSIS — N971 Female infertility of tubal origin: Secondary | ICD-10-CM | POA: Diagnosis not present

## 2019-02-15 DIAGNOSIS — F4323 Adjustment disorder with mixed anxiety and depressed mood: Secondary | ICD-10-CM | POA: Diagnosis not present

## 2019-02-22 DIAGNOSIS — Z113 Encounter for screening for infections with a predominantly sexual mode of transmission: Secondary | ICD-10-CM | POA: Diagnosis not present

## 2019-02-22 DIAGNOSIS — Z3183 Encounter for assisted reproductive fertility procedure cycle: Secondary | ICD-10-CM | POA: Diagnosis not present

## 2019-02-22 DIAGNOSIS — Z3141 Encounter for fertility testing: Secondary | ICD-10-CM | POA: Diagnosis not present

## 2019-02-22 DIAGNOSIS — F4323 Adjustment disorder with mixed anxiety and depressed mood: Secondary | ICD-10-CM | POA: Diagnosis not present

## 2019-02-28 DIAGNOSIS — N8500 Endometrial hyperplasia, unspecified: Secondary | ICD-10-CM | POA: Diagnosis not present

## 2019-02-28 DIAGNOSIS — Z3183 Encounter for assisted reproductive fertility procedure cycle: Secondary | ICD-10-CM | POA: Diagnosis not present

## 2019-02-28 DIAGNOSIS — Z3141 Encounter for fertility testing: Secondary | ICD-10-CM | POA: Diagnosis not present

## 2019-02-28 MED FILL — DOXYCYCLINE HYCLATE 100 MG: 100 | 5 days supply | Qty: 10 | Fill #0

## 2019-03-01 DIAGNOSIS — F4323 Adjustment disorder with mixed anxiety and depressed mood: Secondary | ICD-10-CM | POA: Diagnosis not present

## 2019-03-02 MED FILL — ESTRADIOL 2 MG TABLET: 2 | 30 days supply | Qty: 60 | Fill #0

## 2019-03-05 MED FILL — LIOTHYRONINE SODIUM 5 MCG T: 5 | 30 days supply | Qty: 120 | Fill #3

## 2019-03-08 DIAGNOSIS — F4323 Adjustment disorder with mixed anxiety and depressed mood: Secondary | ICD-10-CM | POA: Diagnosis not present

## 2019-03-09 DIAGNOSIS — Z3183 Encounter for assisted reproductive fertility procedure cycle: Secondary | ICD-10-CM | POA: Diagnosis not present

## 2019-03-12 MED FILL — DOTTI 0.1 MG/24HR PTTW: 0.1 | 28 days supply | Qty: 8 | Fill #0

## 2019-03-14 DIAGNOSIS — Z3183 Encounter for assisted reproductive fertility procedure cycle: Secondary | ICD-10-CM | POA: Diagnosis not present

## 2019-03-14 DIAGNOSIS — Z113 Encounter for screening for infections with a predominantly sexual mode of transmission: Secondary | ICD-10-CM | POA: Diagnosis not present

## 2019-03-14 MED FILL — LEVOTHYROXINE 88 MCG TABLET: 88 | 90 days supply | Qty: 90 | Fill #2

## 2019-03-15 DIAGNOSIS — F4323 Adjustment disorder with mixed anxiety and depressed mood: Secondary | ICD-10-CM | POA: Diagnosis not present

## 2019-03-16 MED FILL — METHYLPREDNISOLONE 4 MG TAB: 4 | 2 days supply | Qty: 8 | Fill #0

## 2019-03-19 MED FILL — DOTTI 0.1 MG/24HR PTTW: 0.1 | 24 days supply | Qty: 16 | Fill #1

## 2019-03-20 DIAGNOSIS — Z3183 Encounter for assisted reproductive fertility procedure cycle: Secondary | ICD-10-CM | POA: Diagnosis not present

## 2019-03-22 DIAGNOSIS — F4323 Adjustment disorder with mixed anxiety and depressed mood: Secondary | ICD-10-CM | POA: Diagnosis not present

## 2019-03-27 DIAGNOSIS — Z3200 Encounter for pregnancy test, result unknown: Secondary | ICD-10-CM | POA: Diagnosis not present

## 2019-03-28 MED FILL — PROGESTERONE OIL 50 MG/ML V: 50 | 30 days supply | Qty: 30 | Fill #1

## 2019-03-29 DIAGNOSIS — Z3200 Encounter for pregnancy test, result unknown: Secondary | ICD-10-CM | POA: Diagnosis not present

## 2019-03-29 DIAGNOSIS — F4323 Adjustment disorder with mixed anxiety and depressed mood: Secondary | ICD-10-CM | POA: Diagnosis not present

## 2019-04-02 MED FILL — LIOTHYRONINE SODIUM 5 MCG T: 5 | 30 days supply | Qty: 120 | Fill #4

## 2019-04-05 DIAGNOSIS — F4323 Adjustment disorder with mixed anxiety and depressed mood: Secondary | ICD-10-CM | POA: Diagnosis not present

## 2019-04-10 DIAGNOSIS — Z3200 Encounter for pregnancy test, result unknown: Secondary | ICD-10-CM | POA: Diagnosis not present

## 2019-04-12 DIAGNOSIS — F4323 Adjustment disorder with mixed anxiety and depressed mood: Secondary | ICD-10-CM | POA: Diagnosis not present

## 2019-04-12 MED FILL — ESTRADIOL 2 MG TABLET: 2 | 30 days supply | Qty: 60 | Fill #1

## 2019-04-13 DIAGNOSIS — Z3200 Encounter for pregnancy test, result unknown: Secondary | ICD-10-CM | POA: Diagnosis not present

## 2019-04-17 MED FILL — ONDANSETRON HCL 4 MG TABLET: 4 | 5 days supply | Qty: 20 | Fill #0

## 2019-04-18 DIAGNOSIS — E890 Postprocedural hypothyroidism: Secondary | ICD-10-CM | POA: Diagnosis not present

## 2019-04-18 DIAGNOSIS — F4323 Adjustment disorder with mixed anxiety and depressed mood: Secondary | ICD-10-CM | POA: Diagnosis not present

## 2019-04-23 DIAGNOSIS — L940 Localized scleroderma [morphea]: Secondary | ICD-10-CM | POA: Diagnosis not present

## 2019-04-23 DIAGNOSIS — E890 Postprocedural hypothyroidism: Secondary | ICD-10-CM | POA: Diagnosis not present

## 2019-04-23 DIAGNOSIS — Z331 Pregnant state, incidental: Secondary | ICD-10-CM | POA: Diagnosis not present

## 2019-04-24 MED FILL — PROGESTERONE OIL 50 MG/ML V: 50 | 30 days supply | Qty: 30 | Fill #2

## 2019-04-27 DIAGNOSIS — O0900 Supervision of pregnancy with history of infertility, unspecified trimester: Secondary | ICD-10-CM | POA: Diagnosis not present

## 2019-04-27 DIAGNOSIS — F4323 Adjustment disorder with mixed anxiety and depressed mood: Secondary | ICD-10-CM | POA: Diagnosis not present

## 2019-04-27 MED FILL — LIOTHYRONINE SODIUM 5 MCG T: 5 | 30 days supply | Qty: 120 | Fill #5

## 2019-04-30 MED FILL — ONDANSETRON HCL 4 MG TABLET: 4 | 5 days supply | Qty: 20 | Fill #0

## 2019-04-30 MED FILL — PROMETHAZINE 25 MG TABLET: 25 | 2 days supply | Qty: 10 | Fill #0

## 2019-04-30 MED FILL — PROGESTERONE 200 MG CAPSULE: 200 | 14 days supply | Qty: 14 | Fill #0

## 2019-05-10 DIAGNOSIS — F4323 Adjustment disorder with mixed anxiety and depressed mood: Secondary | ICD-10-CM | POA: Diagnosis not present

## 2019-05-11 MED FILL — PROMETHAZINE 25 MG TABLET: 25 | 2 days supply | Qty: 10 | Fill #1

## 2019-05-14 MED FILL — ONDANSETRON HCL 4 MG TABLET: 4 | 5 days supply | Qty: 20 | Fill #1

## 2019-05-16 DIAGNOSIS — O26849 Uterine size-date discrepancy, unspecified trimester: Secondary | ICD-10-CM | POA: Diagnosis not present

## 2019-05-16 DIAGNOSIS — Z113 Encounter for screening for infections with a predominantly sexual mode of transmission: Secondary | ICD-10-CM | POA: Diagnosis not present

## 2019-05-16 DIAGNOSIS — Z3480 Encounter for supervision of other normal pregnancy, unspecified trimester: Secondary | ICD-10-CM | POA: Diagnosis not present

## 2019-05-16 DIAGNOSIS — Z124 Encounter for screening for malignant neoplasm of cervix: Secondary | ICD-10-CM | POA: Diagnosis not present

## 2019-05-16 MED FILL — PANTOPRAZOLE SOD DR 40 MG T: 40 | 14 days supply | Qty: 14 | Fill #0

## 2019-05-21 MED FILL — LIOTHYRONINE SODIUM 5 MCG T: 5 | 30 days supply | Qty: 120 | Fill #0

## 2019-05-23 DIAGNOSIS — O99511 Diseases of the respiratory system complicating pregnancy, first trimester: Secondary | ICD-10-CM | POA: Diagnosis not present

## 2019-05-23 DIAGNOSIS — E039 Hypothyroidism, unspecified: Secondary | ICD-10-CM | POA: Diagnosis not present

## 2019-05-23 DIAGNOSIS — O0911 Supervision of pregnancy with history of ectopic or molar pregnancy, first trimester: Secondary | ICD-10-CM | POA: Diagnosis not present

## 2019-05-23 DIAGNOSIS — Z3A12 12 weeks gestation of pregnancy: Secondary | ICD-10-CM | POA: Diagnosis not present

## 2019-05-23 DIAGNOSIS — M349 Systemic sclerosis, unspecified: Secondary | ICD-10-CM | POA: Diagnosis not present

## 2019-05-23 DIAGNOSIS — O99891 Other specified diseases and conditions complicating pregnancy: Secondary | ICD-10-CM | POA: Diagnosis not present

## 2019-05-23 DIAGNOSIS — O99611 Diseases of the digestive system complicating pregnancy, first trimester: Secondary | ICD-10-CM | POA: Diagnosis not present

## 2019-05-23 DIAGNOSIS — O09811 Supervision of pregnancy resulting from assisted reproductive technology, first trimester: Secondary | ICD-10-CM | POA: Diagnosis not present

## 2019-05-23 DIAGNOSIS — O0991 Supervision of high risk pregnancy, unspecified, first trimester: Secondary | ICD-10-CM | POA: Diagnosis not present

## 2019-05-23 DIAGNOSIS — K219 Gastro-esophageal reflux disease without esophagitis: Secondary | ICD-10-CM | POA: Diagnosis not present

## 2019-05-23 DIAGNOSIS — O2691 Pregnancy related conditions, unspecified, first trimester: Secondary | ICD-10-CM | POA: Diagnosis not present

## 2019-05-23 DIAGNOSIS — O99281 Endocrine, nutritional and metabolic diseases complicating pregnancy, first trimester: Secondary | ICD-10-CM | POA: Diagnosis not present

## 2019-05-23 LAB — OB RESULTS CONSOLE ANTIBODY SCREEN: Antibody Screen: NEGATIVE

## 2019-05-23 LAB — OB RESULTS CONSOLE HIV ANTIBODY (ROUTINE TESTING): HIV: NONREACTIVE

## 2019-05-23 LAB — OB RESULTS CONSOLE GC/CHLAMYDIA
Chlamydia: NEGATIVE
Gonorrhea: NEGATIVE

## 2019-05-23 LAB — OB RESULTS CONSOLE RUBELLA ANTIBODY, IGM: Rubella: IMMUNE

## 2019-05-23 LAB — OB RESULTS CONSOLE HEPATITIS B SURFACE ANTIGEN: Hepatitis B Surface Ag: NEGATIVE

## 2019-05-23 LAB — OB RESULTS CONSOLE ABO/RH: RH Type: NEGATIVE

## 2019-05-23 LAB — OB RESULTS CONSOLE RPR: RPR: NONREACTIVE

## 2019-05-23 MED FILL — DOXYLAMINE-PYRIDOXINE 10-10: 10-10 | 30 days supply | Qty: 60 | Fill #0

## 2019-05-23 MED FILL — PROMETHAZINE 25 MG TABLET: 25 | 30 days supply | Qty: 30 | Fill #0

## 2019-05-24 MED FILL — ONDANSETRON HCL 4 MG TABLET: 4 | 10 days supply | Qty: 30 | Fill #0

## 2019-05-29 MED FILL — LEVOTHYROXINE 88 MCG TABLET: 88 | 84 days supply | Qty: 108 | Fill #0

## 2019-05-29 MED FILL — LEVOTHYROXINE 88 MCG TABLET: 88 | 10 days supply | Qty: 10 | Fill #3

## 2019-05-30 DIAGNOSIS — F4323 Adjustment disorder with mixed anxiety and depressed mood: Secondary | ICD-10-CM | POA: Diagnosis not present

## 2019-06-04 DIAGNOSIS — Z369 Encounter for antenatal screening, unspecified: Secondary | ICD-10-CM | POA: Diagnosis not present

## 2019-06-04 MED FILL — ESOMEPRAZOLE MAG DR 40 MG C: 40 | 10 days supply | Qty: 10 | Fill #0

## 2019-06-04 MED FILL — METOCLOPRAMIDE 10 MG TABLET: 10 | 10 days supply | Qty: 40 | Fill #0

## 2019-06-08 NOTE — L&D Delivery Note (Signed)
Delivery Note At 7:15 PM a viable and healthy female was delivered via Vaginal, Spontaneous (Presentation: left   Occiput Anterior).  APGAR: 8, 9; weight pending .   Placenta status: Spontaneous, Intact.  Cord: 3 vessels  Meconium stained fluid was noted at the time of amniotomy. The patient pushed for approximately 3.5  hours and delivered a vigorous female infant in the LOA presentation with apgars of 8 at 1 min and 9 at 5. The NICU team attended the delivery but the infant cried vigorously at the time of delivery after bulb suctioning. No additional intervention was required. After a 60 second delay in cord clamping the cord was clamped and cut. With delivery of the placenta, uterine atony was noted. This improved with bimanual massage but returned and methergine IM was administered x 1. The second degree perineal laceration was repaired in the usual fashion. Mother and baby are doing well after delivery  Anesthesia: Epidural Episiotomy: None Lacerations: 2nd degree Suture Repair: 3.0 vicryl Est. Blood Loss (mL):  862 mL   Mom to postpartum.  Baby to Couplet care / Skin to Skin.  Vanessa Kick 12/04/2019, 7:57 PM

## 2019-06-11 DIAGNOSIS — R1319 Other dysphagia: Secondary | ICD-10-CM | POA: Diagnosis not present

## 2019-06-11 DIAGNOSIS — R110 Nausea: Secondary | ICD-10-CM | POA: Diagnosis not present

## 2019-06-11 DIAGNOSIS — K2100 Gastro-esophageal reflux disease with esophagitis, without bleeding: Secondary | ICD-10-CM | POA: Diagnosis not present

## 2019-06-12 MED FILL — LIOTHYRONINE SODIUM 5 MCG T: 5 | 28 days supply | Qty: 144 | Fill #0

## 2019-06-14 DIAGNOSIS — F4323 Adjustment disorder with mixed anxiety and depressed mood: Secondary | ICD-10-CM | POA: Diagnosis not present

## 2019-06-21 MED FILL — ONDANSETRON HCL 4 MG TABLET: 4 | 10 days supply | Qty: 30 | Fill #1

## 2019-06-25 DIAGNOSIS — Z369 Encounter for antenatal screening, unspecified: Secondary | ICD-10-CM | POA: Diagnosis not present

## 2019-06-25 DIAGNOSIS — E0500 Thyrotoxicosis with diffuse goiter without thyrotoxic crisis or storm: Secondary | ICD-10-CM | POA: Diagnosis not present

## 2019-06-25 DIAGNOSIS — Z3482 Encounter for supervision of other normal pregnancy, second trimester: Secondary | ICD-10-CM | POA: Diagnosis not present

## 2019-06-26 DIAGNOSIS — O09811 Supervision of pregnancy resulting from assisted reproductive technology, first trimester: Secondary | ICD-10-CM | POA: Diagnosis not present

## 2019-06-26 DIAGNOSIS — Z369 Encounter for antenatal screening, unspecified: Secondary | ICD-10-CM | POA: Diagnosis not present

## 2019-06-26 DIAGNOSIS — O0991 Supervision of high risk pregnancy, unspecified, first trimester: Secondary | ICD-10-CM | POA: Diagnosis not present

## 2019-06-26 DIAGNOSIS — Z3A12 12 weeks gestation of pregnancy: Secondary | ICD-10-CM | POA: Diagnosis not present

## 2019-06-27 DIAGNOSIS — F4323 Adjustment disorder with mixed anxiety and depressed mood: Secondary | ICD-10-CM | POA: Diagnosis not present

## 2019-06-28 MED FILL — ESOMEPRAZOLE MAG DR 40 MG C: 40 | 30 days supply | Qty: 30 | Fill #0

## 2019-07-05 DIAGNOSIS — Z3689 Encounter for other specified antenatal screening: Secondary | ICD-10-CM | POA: Diagnosis not present

## 2019-07-05 DIAGNOSIS — O99282 Endocrine, nutritional and metabolic diseases complicating pregnancy, second trimester: Secondary | ICD-10-CM | POA: Diagnosis not present

## 2019-07-05 DIAGNOSIS — Z369 Encounter for antenatal screening, unspecified: Secondary | ICD-10-CM | POA: Diagnosis not present

## 2019-07-05 DIAGNOSIS — Z3A12 12 weeks gestation of pregnancy: Secondary | ICD-10-CM | POA: Diagnosis not present

## 2019-07-05 DIAGNOSIS — Z3A18 18 weeks gestation of pregnancy: Secondary | ICD-10-CM | POA: Diagnosis not present

## 2019-07-05 DIAGNOSIS — E079 Disorder of thyroid, unspecified: Secondary | ICD-10-CM | POA: Diagnosis not present

## 2019-07-05 DIAGNOSIS — M349 Systemic sclerosis, unspecified: Secondary | ICD-10-CM | POA: Diagnosis not present

## 2019-07-05 DIAGNOSIS — O09812 Supervision of pregnancy resulting from assisted reproductive technology, second trimester: Secondary | ICD-10-CM | POA: Diagnosis not present

## 2019-07-05 DIAGNOSIS — O4403 Placenta previa specified as without hemorrhage, third trimester: Secondary | ICD-10-CM | POA: Diagnosis not present

## 2019-07-05 DIAGNOSIS — Z3482 Encounter for supervision of other normal pregnancy, second trimester: Secondary | ICD-10-CM | POA: Diagnosis not present

## 2019-07-05 DIAGNOSIS — M3500 Sicca syndrome, unspecified: Secondary | ICD-10-CM | POA: Diagnosis not present

## 2019-07-05 DIAGNOSIS — O09813 Supervision of pregnancy resulting from assisted reproductive technology, third trimester: Secondary | ICD-10-CM | POA: Diagnosis not present

## 2019-07-05 DIAGNOSIS — O99892 Other specified diseases and conditions complicating childbirth: Secondary | ICD-10-CM | POA: Diagnosis not present

## 2019-07-09 MED FILL — LIOTHYRONINE SODIUM 5 MCG T: 5 | 28 days supply | Qty: 144 | Fill #1

## 2019-07-10 ENCOUNTER — Inpatient Hospital Stay (HOSPITAL_BASED_OUTPATIENT_CLINIC_OR_DEPARTMENT_OTHER): Payer: 59

## 2019-07-10 ENCOUNTER — Inpatient Hospital Stay (HOSPITAL_COMMUNITY)
Admission: AD | Admit: 2019-07-10 | Discharge: 2019-07-10 | Disposition: A | Discharge status: Home / Self Care | Payer: 59 | Attending: Obstetrics and Gynecology | Admitting: Obstetrics and Gynecology

## 2019-07-10 ENCOUNTER — Other Ambulatory Visit: Payer: Self-pay

## 2019-07-10 ENCOUNTER — Encounter (HOSPITAL_COMMUNITY): Payer: Self-pay | Admitting: Obstetrics and Gynecology

## 2019-07-10 DIAGNOSIS — O9A212 Injury, poisoning and certain other consequences of external causes complicating pregnancy, second trimester: Secondary | ICD-10-CM | POA: Diagnosis not present

## 2019-07-10 DIAGNOSIS — Z3A18 18 weeks gestation of pregnancy: Secondary | ICD-10-CM | POA: Insufficient documentation

## 2019-07-10 DIAGNOSIS — O26892 Other specified pregnancy related conditions, second trimester: Secondary | ICD-10-CM | POA: Diagnosis not present

## 2019-07-10 DIAGNOSIS — O09812 Supervision of pregnancy resulting from assisted reproductive technology, second trimester: Secondary | ICD-10-CM

## 2019-07-10 HISTORY — DX: Thyrotoxicosis with diffuse goiter without thyrotoxic crisis or storm: E05.00

## 2019-07-10 LAB — URINALYSIS, ROUTINE W REFLEX MICROSCOPIC
Bilirubin Urine: NEGATIVE
Glucose, UA: NEGATIVE mg/dL
Hgb urine dipstick: NEGATIVE
Ketones, ur: NEGATIVE mg/dL
Leukocytes,Ua: NEGATIVE
Nitrite: NEGATIVE
Protein, ur: NEGATIVE mg/dL
Specific Gravity, Urine: 1.012 (ref 1.005–1.030)
pH: 6 (ref 5.0–8.0)

## 2019-07-10 NOTE — MAU Provider Note (Signed)
Chief Complaint: Fall   First Provider Initiated Contact with Patient 07/10/19 1627     SUBJECTIVE HPI: Melinda Zimmerman is a 34 y.o. G2P0010 at [redacted]w[redacted]d who presents to Maternity Admissions for evaluation. Was on an elevator this afternoon when it dropped 3 stories. Did not fall but reports feeling like she was on a roller coaster. Since then has not had abdominal pain, vaginal bleeding, or LOF. Has a placenta previa per her anatomy ultrasound last week. Goes to [facility] for her prenatal care.   Past Medical History:  Diagnosis Date  . Graves disease   . Scleroderma ([place]) 1995   OB History  Gravida Para Term Preterm AB Living  2       1 0  SAB TAB Ectopic Multiple Live Births      1        # Outcome Date GA Lbr Len/2nd Weight Sex Delivery Anes PTL Lv  2 Current           1 Ectopic 2020           Past Surgical History:  Procedure Laterality Date  . SALPINGECTOMY Left 10/2018  . THYROIDECTOMY  2010   Social History   Socioeconomic History  . Marital status: Married    Spouse name: Not on file  . Number of children: Not on file  . Years of education: Not on file  . Highest education level: Not on file  Occupational History  . Not on file  Tobacco Use  . Smoking status: Never Smoker  . Smokeless tobacco: Never Used  Substance and Sexual Activity  . Alcohol use: Never  . Drug use: Never  . Sexual activity: Not Currently  Other Topics Concern  . Not on file  Social History Narrative  . Not on file   Social Determinants of Health   Financial Resource Strain:   . Difficulty of Paying Living Expenses: Not on file  Food Insecurity:   . Worried About Charity fundraiser in the Last Year: Not on file  . Ran Out of Food in the Last Year: Not on file  Transportation Needs:   . Lack of Transportation (Medical): Not on file  . Lack of Transportation (Non-Medical): Not on file  Physical Activity:   . Days of Exercise per Week: Not on file  .  Minutes of Exercise per Session: Not on file  Stress:   . Feeling of Stress : Not on file  Social Connections:   . Frequency of Communication with Friends and Family: Not on file  . Frequency of Social Gatherings with Friends and Family: Not on file  . Attends Religious Services: Not on file  . Active Member of Clubs or Organizations: Not on file  . Attends Archivist Meetings: Not on file  . Marital Status: Not on file  Intimate Partner Violence:   . Fear of Current or Ex-Partner: Not on file  . Emotionally Abused: Not on file  . Physically Abused: Not on file  . Sexually Abused: Not on file   Family History  Problem Relation Age of Onset  . Diabetes Father    No current facility-administered medications on file prior to encounter.   Current Outpatient Medications on File Prior to Encounter  Medication Sig Dispense Refill  . acetaminophen (TYLENOL) 500 MG tablet Take 500 mg by mouth every 6 (six) hours as needed.    . calcium carbonate (TUMS - DOSED IN MG ELEMENTAL CALCIUM)  500 MG chewable tablet Chew 1 tablet by mouth daily.    Marland Kitchen esomeprazole (NEXIUM) 40 MG capsule Take 40 mg by mouth daily at 12 noon.    . famotidine (PEPCID) 20 MG tablet Take 20 mg by mouth 2 (two) times daily.    Marland Kitchen levothyroxine (SYNTHROID) 88 MCG tablet Take 88 mcg by mouth daily before breakfast. 176 on Saturday and Sundays    . liothyronine (CYTOMEL) 5 MCG tablet Take 5 mcg by mouth daily. Pt takes 20 mcg on Mon-Fri and on Sat and Sun she takes 40 mcg    . Prenatal Vit-Fe Fumarate-FA (MULTIVITAMIN-PRENATAL) 27-0.8 MG TABS tablet Take 1 tablet by mouth daily at 12 noon.     Allergies  Allergen Reactions  . Iodine Rash    I have reviewed patient's Past Medical Hx, Surgical Hx, Family Hx, Social Hx, medications and allergies.   Review of Systems  Constitutional: Negative.   Gastrointestinal: Negative.   Genitourinary: Negative.     OBJECTIVE Patient Vitals for the past 24 hrs:  BP Temp  Temp src Pulse Resp SpO2 Height Weight  07/10/19 1726 (!) 101/59 -- -- 79 -- -- -- --  07/10/19 1548 103/68 98 F (36.7 C) Oral 88 20 98 % -- --  07/10/19 1541 -- -- -- -- -- -- 5\' 3"  (1.6 m) 65.8 kg   Constitutional: Well-developed, well-nourished female in no acute distress.  Cardiovascular: normal rate & rhythm, no murmur Respiratory: normal rate and effort. Lung sounds clear throughout GI: Abd soft, non-tender, Pos BS x 4. No guarding or rebound tenderness MS: Extremities nontender, no edema, normal ROM Neurologic: Alert and oriented x 4.      LAB RESULTS Results for orders placed or performed during the hospital encounter of 07/10/19 (from the past 24 hour(s))  Urinalysis, Routine w reflex microscopic     Status: None   Collection Time: 07/10/19  4:09 PM  Result Value Ref Range   Color, Urine YELLOW YELLOW   APPearance CLEAR CLEAR   Specific Gravity, Urine 1.012 1.005 - 1.030   pH 6.0 5.0 - 8.0   Glucose, UA NEGATIVE NEGATIVE mg/dL   Hgb urine dipstick NEGATIVE NEGATIVE   Bilirubin Urine NEGATIVE NEGATIVE   Ketones, ur NEGATIVE NEGATIVE mg/dL   Protein, ur NEGATIVE NEGATIVE mg/dL   Nitrite NEGATIVE NEGATIVE   Leukocytes,Ua NEGATIVE NEGATIVE    IMAGING No results found.  MAU COURSE Orders Placed This Encounter  Procedures  . [place] MFM OB LIMITED  . Discharge patient   No orders of the defined types were placed in this encounter.   MDM FHT present Abdomen soft & non tender No abdominal pain, vaginal bleeding, or leaking of fluid.  Limited OB ultrasound. Low lying placenta. Otherwise normal.   ASSESSMENT 1. Traumatic injury during pregnancy in second trimester   2. [redacted] weeks gestation of pregnancy     PLAN Discharge home in stable condition. Discussed reasons to return to MAU Follow-up Information    Ob/Gyn, [facility] Follow up.   Why: keep scheduled appointment or call office as needed Contact information: [place]  [id number] [phone]        [facility] Follow up.   Specialty: Obstetrics and Gynecology Why: return for abdominal pain, vaginal bleeding, or leaking of watery fluid.  Contact information: [address] [id number] [place] [phone]         Allergies as of 07/10/2019  Reactions   Iodine Rash      Medication List    TAKE these medications   acetaminophen 500 MG tablet Commonly known as: TYLENOL Take 500 mg by mouth every 6 (six) hours as needed.   calcium carbonate 500 MG chewable tablet Commonly known as: TUMS - dosed in mg elemental calcium Chew 1 tablet by mouth daily.   esomeprazole 40 MG capsule Commonly known as: NEXIUM Take 40 mg by mouth daily at 12 noon.   famotidine 20 MG tablet Commonly known as: PEPCID Take 20 mg by mouth 2 (two) times daily.   levothyroxine 88 MCG tablet Commonly known as: SYNTHROID Take 88 mcg by mouth daily before breakfast. 176 on Saturday and Sundays   liothyronine 5 MCG tablet Commonly known as: CYTOMEL Take 5 mcg by mouth daily. Pt takes 20 mcg on Mon-Fri and on Sat and Sun she takes 40 mcg   multivitamin-prenatal 27-0.8 MG Tabs tablet Take 1 tablet by mouth daily at 12 noon.        Jorje Guild, NP 07/10/2019  5:36 PM

## 2019-07-10 NOTE — Discharge Instructions (Signed)
Preventing Injuries During Pregnancy Trauma is the most common cause of injury and death in pregnant women. This can also result in serious harm to the baby or even death. How can injuries affect my pregnancy? Your baby is protected in the womb (uterus) by a sac filled with fluid (amniotic sac). Your baby can be harmed if there is a direct blow to your abdomen and pelvis. Trauma may be caused by:  Falls. These are more common in the second and third trimester of pregnancy.  Automobile accidents.  Domestic violence or assault.  Severe burns, such as from fire or electricity. These injuries can result in:  Tearing of your uterus.  The placenta pulling away from the wall of the uterus (placental abruption).  The amniotic sac breaking open (rupture of membranes).  Blockage or decrease in the blood supply to your baby.  Going into labor earlier than expected.  Severe injuries to other parts of your body, such as your brain, spine, heart, lungs, or other organs. Minor falls and low-impact automobile accidents do not usually harm your baby, even if they cause a little harm to you. What can I do to lower my risk? Safety  Remove slippery rugs and loose objects on the floor. They increase your risk of tripping or slipping.  Wear comfortable shoes that have a good grip on the sole. Do not wear high-heeled shoes.  Always wear your seat belt properly when riding in a car. Use both the lap and shoulder belt, with the lap belt below your abdomen. Always practice safe driving. Do not ride on a motorcycle while pregnant. Activity  Avoid walking on wet or slippery floors.  Do not participate in rough and violent activities or sports.  Avoid high-risk situations and activities such as: ? Lifting heavy pots of boiling or hot liquids. ? Fixing electrical problems. ? Being near fires or starting fires. General instructions  Take over-the-counter and prescription medicines only as told by your  health care provider.  Know your blood type and the father's blood type in case you develop vaginal bleeding or experience an injury for which a blood transfusion is needed.  Spousal abuse can be a serious cause of trauma during pregnancy. If you are a victim of domestic violence or assault: ? Call your local emergency services (911 in the [place] ? Contact the National Domestic Violence Hotline for help and support. When should I seek immediate medical care? Get help right away if:  You fall on your abdomen or experience any serious blow to your abdomen.  You develop stiffness in your neck or pain after a fall or from other trauma.  You develop a headache or vision problems after a fall or from other trauma.  You do not feel the baby moving after a fall or trauma, or you feel that the baby is not moving as much as before the fall or trauma.  You have been the victim of domestic violence or any other kind of physical attack.  You have been in a car accident.  You develop vaginal bleeding.  You have fluid leaking from the vagina.  You develop uterine contractions. Symptoms include pelvic cramping, pain, or serious low back pain.  You become weak, faint, or have uncontrolled vomiting after trauma.  You have a serious burn. This includes burns to the face, neck, hands, or genitals, or burns greater than the size of your palm anywhere else. Summary  Trauma is the most common cause of injury and death   in pregnant women and can also lead to injury or death of the baby.  Falls, automobile accidents, domestic violence or assault, and severe burns can injure you or your baby. Make sure to get medical help right away if you experience any of these during your pregnancy.  Take steps to prevent slips or falls in your home, such as avoiding slippery floors and removing loose rugs.  Always wear your seat belt properly when riding in a car. Practice safe driving. This information is not  intended to replace advice given to you by your health care provider. Make sure you discuss any questions you have with your health care provider. Document Revised: 09/21/2018 Document Reviewed: 06/02/2016 Elsevier Patient Education  2020 Elsevier Inc.  

## 2019-07-10 NOTE — MAU Note (Signed)
Presents with c/o jolt in elevator @ 1200 this afternoon.  States elevator fell 3 floors and was jolted, never fell.  Denies VB.

## 2019-07-12 DIAGNOSIS — F4323 Adjustment disorder with mixed anxiety and depressed mood: Secondary | ICD-10-CM | POA: Diagnosis not present

## 2019-07-12 DIAGNOSIS — K2100 Gastro-esophageal reflux disease with esophagitis, without bleeding: Secondary | ICD-10-CM | POA: Diagnosis not present

## 2019-07-12 DIAGNOSIS — R110 Nausea: Secondary | ICD-10-CM | POA: Diagnosis not present

## 2019-07-12 MED FILL — FAMOTIDINE 20 MG TABS: 20 | 90 days supply | Qty: 90 | Fill #0

## 2019-07-16 DIAGNOSIS — M3500 Sicca syndrome, unspecified: Secondary | ICD-10-CM | POA: Diagnosis not present

## 2019-07-16 DIAGNOSIS — O99412 Diseases of the circulatory system complicating pregnancy, second trimester: Secondary | ICD-10-CM | POA: Diagnosis not present

## 2019-07-16 DIAGNOSIS — O09812 Supervision of pregnancy resulting from assisted reproductive technology, second trimester: Secondary | ICD-10-CM | POA: Diagnosis not present

## 2019-07-16 DIAGNOSIS — O359XX0 Maternal care for (suspected) fetal abnormality and damage, unspecified, not applicable or unspecified: Secondary | ICD-10-CM | POA: Diagnosis not present

## 2019-07-17 MED FILL — ESOMEPRAZOLE MAG DR 20 MG C: 20 | 90 days supply | Qty: 90 | Fill #0

## 2019-07-23 DIAGNOSIS — E890 Postprocedural hypothyroidism: Secondary | ICD-10-CM | POA: Diagnosis not present

## 2019-07-24 DIAGNOSIS — Z369 Encounter for antenatal screening, unspecified: Secondary | ICD-10-CM | POA: Diagnosis not present

## 2019-07-26 DIAGNOSIS — F4323 Adjustment disorder with mixed anxiety and depressed mood: Secondary | ICD-10-CM | POA: Diagnosis not present

## 2019-07-27 DIAGNOSIS — Z331 Pregnant state, incidental: Secondary | ICD-10-CM | POA: Diagnosis not present

## 2019-07-27 DIAGNOSIS — E890 Postprocedural hypothyroidism: Secondary | ICD-10-CM | POA: Diagnosis not present

## 2019-08-09 DIAGNOSIS — F4323 Adjustment disorder with mixed anxiety and depressed mood: Secondary | ICD-10-CM | POA: Diagnosis not present

## 2019-08-09 MED FILL — LIOTHYRONINE SODIUM 5 MCG T: 5 | 28 days supply | Qty: 144 | Fill #2

## 2019-08-14 DIAGNOSIS — E890 Postprocedural hypothyroidism: Secondary | ICD-10-CM | POA: Diagnosis not present

## 2019-08-14 DIAGNOSIS — Z7689 Persons encountering health services in other specified circumstances: Secondary | ICD-10-CM | POA: Diagnosis not present

## 2019-08-15 DIAGNOSIS — E890 Postprocedural hypothyroidism: Secondary | ICD-10-CM | POA: Diagnosis not present

## 2019-08-15 DIAGNOSIS — Z331 Pregnant state, incidental: Secondary | ICD-10-CM | POA: Diagnosis not present

## 2019-08-16 DIAGNOSIS — Z369 Encounter for antenatal screening, unspecified: Secondary | ICD-10-CM | POA: Diagnosis not present

## 2019-08-27 DIAGNOSIS — O4692 Antepartum hemorrhage, unspecified, second trimester: Secondary | ICD-10-CM | POA: Diagnosis not present

## 2019-08-28 MED FILL — LEVOTHYROXINE 88 MCG TABLET: 88 | 84 days supply | Qty: 108 | Fill #1

## 2019-09-03 DIAGNOSIS — E890 Postprocedural hypothyroidism: Secondary | ICD-10-CM | POA: Diagnosis not present

## 2019-09-04 DIAGNOSIS — E890 Postprocedural hypothyroidism: Secondary | ICD-10-CM | POA: Diagnosis not present

## 2019-09-04 DIAGNOSIS — L940 Localized scleroderma [morphea]: Secondary | ICD-10-CM | POA: Diagnosis not present

## 2019-09-06 DIAGNOSIS — 419620001 Death: Secondary | SNOMED CT | POA: Diagnosis not present

## 2019-09-06 DEATH — deceased

## 2019-09-11 DIAGNOSIS — Z8759 Personal history of other complications of pregnancy, childbirth and the puerperium: Secondary | ICD-10-CM | POA: Diagnosis not present

## 2019-09-11 DIAGNOSIS — M349 Systemic sclerosis, unspecified: Secondary | ICD-10-CM | POA: Diagnosis not present

## 2019-09-11 DIAGNOSIS — O4402 Placenta previa specified as without hemorrhage, second trimester: Secondary | ICD-10-CM | POA: Diagnosis not present

## 2019-09-11 DIAGNOSIS — O0992 Supervision of high risk pregnancy, unspecified, second trimester: Secondary | ICD-10-CM | POA: Diagnosis not present

## 2019-09-11 DIAGNOSIS — Z23 Encounter for immunization: Secondary | ICD-10-CM | POA: Diagnosis not present

## 2019-09-11 DIAGNOSIS — Z3689 Encounter for other specified antenatal screening: Secondary | ICD-10-CM | POA: Diagnosis not present

## 2019-09-11 DIAGNOSIS — O219 Vomiting of pregnancy, unspecified: Secondary | ICD-10-CM | POA: Diagnosis not present

## 2019-09-11 DIAGNOSIS — E039 Hypothyroidism, unspecified: Secondary | ICD-10-CM | POA: Diagnosis not present

## 2019-09-11 DIAGNOSIS — O09812 Supervision of pregnancy resulting from assisted reproductive technology, second trimester: Secondary | ICD-10-CM | POA: Diagnosis not present

## 2019-09-11 DIAGNOSIS — O99340 Other mental disorders complicating pregnancy, unspecified trimester: Secondary | ICD-10-CM | POA: Diagnosis not present

## 2019-09-11 DIAGNOSIS — F419 Anxiety disorder, unspecified: Secondary | ICD-10-CM | POA: Diagnosis not present

## 2019-09-11 DIAGNOSIS — Z3A27 27 weeks gestation of pregnancy: Secondary | ICD-10-CM | POA: Diagnosis not present

## 2019-09-13 MED FILL — LIOTHYRONINE SODIUM 5 MCG T: 5 | 28 days supply | Qty: 144 | Fill #3

## 2019-09-14 DIAGNOSIS — F4323 Adjustment disorder with mixed anxiety and depressed mood: Secondary | ICD-10-CM | POA: Diagnosis not present

## 2019-09-21 DIAGNOSIS — Z369 Encounter for antenatal screening, unspecified: Secondary | ICD-10-CM | POA: Diagnosis not present

## 2019-09-24 DIAGNOSIS — E890 Postprocedural hypothyroidism: Secondary | ICD-10-CM | POA: Diagnosis not present

## 2019-09-26 DIAGNOSIS — R7309 Other abnormal glucose: Secondary | ICD-10-CM | POA: Diagnosis not present

## 2019-09-26 DIAGNOSIS — E890 Postprocedural hypothyroidism: Secondary | ICD-10-CM | POA: Diagnosis not present

## 2019-09-26 DIAGNOSIS — Z331 Pregnant state, incidental: Secondary | ICD-10-CM | POA: Diagnosis not present

## 2019-09-27 DIAGNOSIS — Z2913 Encounter for prophylactic Rho(D) immune globulin: Secondary | ICD-10-CM | POA: Diagnosis not present

## 2019-09-27 DIAGNOSIS — O0992 Supervision of high risk pregnancy, unspecified, second trimester: Secondary | ICD-10-CM | POA: Diagnosis not present

## 2019-10-02 MED FILL — ESOMEPRAZOLE MAG DR 20 MG C: 20 | 7 days supply | Qty: 14 | Fill #0

## 2019-10-03 DIAGNOSIS — Z369 Encounter for antenatal screening, unspecified: Secondary | ICD-10-CM | POA: Diagnosis not present

## 2019-10-03 DIAGNOSIS — M25519 Pain in unspecified shoulder: Secondary | ICD-10-CM | POA: Diagnosis not present

## 2019-10-05 DIAGNOSIS — F4323 Adjustment disorder with mixed anxiety and depressed mood: Secondary | ICD-10-CM | POA: Diagnosis not present

## 2019-10-08 DIAGNOSIS — K2100 Gastro-esophageal reflux disease with esophagitis, without bleeding: Secondary | ICD-10-CM | POA: Diagnosis not present

## 2019-10-08 DIAGNOSIS — R110 Nausea: Secondary | ICD-10-CM | POA: Diagnosis not present

## 2019-10-10 DIAGNOSIS — F419 Anxiety disorder, unspecified: Secondary | ICD-10-CM | POA: Diagnosis not present

## 2019-10-10 DIAGNOSIS — Z6791 Unspecified blood type, Rh negative: Secondary | ICD-10-CM | POA: Diagnosis not present

## 2019-10-10 DIAGNOSIS — O99283 Endocrine, nutritional and metabolic diseases complicating pregnancy, third trimester: Secondary | ICD-10-CM | POA: Diagnosis not present

## 2019-10-10 DIAGNOSIS — O26892 Other specified pregnancy related conditions, second trimester: Secondary | ICD-10-CM | POA: Diagnosis not present

## 2019-10-10 DIAGNOSIS — Z8759 Personal history of other complications of pregnancy, childbirth and the puerperium: Secondary | ICD-10-CM | POA: Diagnosis not present

## 2019-10-10 DIAGNOSIS — O99343 Other mental disorders complicating pregnancy, third trimester: Secondary | ICD-10-CM | POA: Diagnosis not present

## 2019-10-10 DIAGNOSIS — K219 Gastro-esophageal reflux disease without esophagitis: Secondary | ICD-10-CM | POA: Diagnosis not present

## 2019-10-10 DIAGNOSIS — E890 Postprocedural hypothyroidism: Secondary | ICD-10-CM | POA: Diagnosis not present

## 2019-10-10 DIAGNOSIS — O4440 Low lying placenta NOS or without hemorrhage, unspecified trimester: Secondary | ICD-10-CM | POA: Diagnosis not present

## 2019-10-10 DIAGNOSIS — Z3A31 31 weeks gestation of pregnancy: Secondary | ICD-10-CM | POA: Diagnosis not present

## 2019-10-10 DIAGNOSIS — O99613 Diseases of the digestive system complicating pregnancy, third trimester: Secondary | ICD-10-CM | POA: Diagnosis not present

## 2019-10-10 DIAGNOSIS — Z79899 Other long term (current) drug therapy: Secondary | ICD-10-CM | POA: Diagnosis not present

## 2019-10-10 DIAGNOSIS — O09813 Supervision of pregnancy resulting from assisted reproductive technology, third trimester: Secondary | ICD-10-CM | POA: Diagnosis not present

## 2019-10-10 MED FILL — ESOMEPRAZOLE MAG DR 20 MG C: 20 | 90 days supply | Qty: 90 | Fill #1

## 2019-10-11 MED FILL — ESOMEPRAZOLE MAG DR 20 MG C: 20 | 30 days supply | Qty: 30 | Fill #2

## 2019-10-15 DIAGNOSIS — E890 Postprocedural hypothyroidism: Secondary | ICD-10-CM | POA: Diagnosis not present

## 2019-10-16 DIAGNOSIS — Z3483 Encounter for supervision of other normal pregnancy, third trimester: Secondary | ICD-10-CM | POA: Diagnosis not present

## 2019-10-16 DIAGNOSIS — Z3482 Encounter for supervision of other normal pregnancy, second trimester: Secondary | ICD-10-CM | POA: Diagnosis not present

## 2019-10-18 DIAGNOSIS — Z369 Encounter for antenatal screening, unspecified: Secondary | ICD-10-CM | POA: Diagnosis not present

## 2019-10-19 DIAGNOSIS — F4323 Adjustment disorder with mixed anxiety and depressed mood: Secondary | ICD-10-CM | POA: Diagnosis not present

## 2019-10-23 DIAGNOSIS — E890 Postprocedural hypothyroidism: Secondary | ICD-10-CM | POA: Diagnosis not present

## 2019-10-23 DIAGNOSIS — K219 Gastro-esophageal reflux disease without esophagitis: Secondary | ICD-10-CM | POA: Diagnosis not present

## 2019-10-23 DIAGNOSIS — Z331 Pregnant state, incidental: Secondary | ICD-10-CM | POA: Diagnosis not present

## 2019-10-24 MED FILL — LIOTHYRONINE SODIUM 5 MCG T: 5 | 28 days supply | Qty: 144 | Fill #4

## 2019-11-01 DIAGNOSIS — Z369 Encounter for antenatal screening, unspecified: Secondary | ICD-10-CM | POA: Diagnosis not present

## 2019-11-01 DIAGNOSIS — F4323 Adjustment disorder with mixed anxiety and depressed mood: Secondary | ICD-10-CM | POA: Diagnosis not present

## 2019-11-09 DIAGNOSIS — Z3480 Encounter for supervision of other normal pregnancy, unspecified trimester: Secondary | ICD-10-CM | POA: Diagnosis not present

## 2019-11-09 DIAGNOSIS — Z369 Encounter for antenatal screening, unspecified: Secondary | ICD-10-CM | POA: Diagnosis not present

## 2019-11-11 LAB — OB RESULTS CONSOLE GBS: GBS: NEGATIVE

## 2019-11-15 DIAGNOSIS — Z369 Encounter for antenatal screening, unspecified: Secondary | ICD-10-CM | POA: Diagnosis not present

## 2019-11-15 DIAGNOSIS — F4323 Adjustment disorder with mixed anxiety and depressed mood: Secondary | ICD-10-CM | POA: Diagnosis not present

## 2019-11-21 DIAGNOSIS — Z369 Encounter for antenatal screening, unspecified: Secondary | ICD-10-CM | POA: Diagnosis not present

## 2019-11-29 DIAGNOSIS — F4323 Adjustment disorder with mixed anxiety and depressed mood: Secondary | ICD-10-CM | POA: Diagnosis not present

## 2019-11-29 DIAGNOSIS — O26899 Other specified pregnancy related conditions, unspecified trimester: Secondary | ICD-10-CM | POA: Diagnosis not present

## 2019-11-30 ENCOUNTER — Other Ambulatory Visit: Payer: Self-pay | Admitting: Obstetrics and Gynecology

## 2019-11-30 MED FILL — LIOTHYRONINE SODIUM 5 MCG T: 5 | 28 days supply | Qty: 144 | Fill #5

## 2019-12-03 ENCOUNTER — Telehealth (HOSPITAL_COMMUNITY): Payer: Self-pay | Admitting: *Deleted

## 2019-12-03 ENCOUNTER — Encounter (HOSPITAL_COMMUNITY): Payer: Self-pay | Admitting: *Deleted

## 2019-12-03 ENCOUNTER — Other Ambulatory Visit (HOSPITAL_COMMUNITY)
Admission: RE | Admit: 2019-12-03 | Discharge: 2019-12-03 | Disposition: A | Discharge status: Home / Self Care | Payer: 59 | Source: Ambulatory Visit | Attending: Obstetrics and Gynecology | Admitting: Obstetrics and Gynecology

## 2019-12-03 DIAGNOSIS — Z20822 Contact with and (suspected) exposure to covid-19: Secondary | ICD-10-CM | POA: Diagnosis not present

## 2019-12-03 DIAGNOSIS — Z01812 Encounter for preprocedural laboratory examination: Secondary | ICD-10-CM | POA: Diagnosis not present

## 2019-12-03 LAB — SARS CORONAVIRUS 2 (TAT 6-24 HRS): SARS Coronavirus 2: NEGATIVE

## 2019-12-03 NOTE — Telephone Encounter (Signed)
Preadmission screen  

## 2019-12-04 ENCOUNTER — Other Ambulatory Visit: Payer: Self-pay

## 2019-12-04 ENCOUNTER — Inpatient Hospital Stay (HOSPITAL_COMMUNITY): Payer: 59 | Admitting: Anesthesiology

## 2019-12-04 ENCOUNTER — Inpatient Hospital Stay (HOSPITAL_COMMUNITY)
Admission: RE | Admit: 2019-12-04 | Discharge: 2019-12-06 | DRG: 806 | Disposition: A | Discharge status: Home / Self Care | Payer: 59 | Attending: Obstetrics and Gynecology | Admitting: Obstetrics and Gynecology

## 2019-12-04 ENCOUNTER — Encounter (HOSPITAL_COMMUNITY): Payer: Self-pay | Admitting: Obstetrics and Gynecology

## 2019-12-04 ENCOUNTER — Inpatient Hospital Stay (HOSPITAL_COMMUNITY): Payer: 59

## 2019-12-04 DIAGNOSIS — O9081 Anemia of the puerperium: Secondary | ICD-10-CM | POA: Diagnosis not present

## 2019-12-04 DIAGNOSIS — Z3A39 39 weeks gestation of pregnancy: Secondary | ICD-10-CM | POA: Diagnosis not present

## 2019-12-04 DIAGNOSIS — D62 Acute posthemorrhagic anemia: Secondary | ICD-10-CM | POA: Diagnosis not present

## 2019-12-04 DIAGNOSIS — O99284 Endocrine, nutritional and metabolic diseases complicating childbirth: Principal | ICD-10-CM | POA: Diagnosis present

## 2019-12-04 DIAGNOSIS — O43893 Other placental disorders, third trimester: Secondary | ICD-10-CM | POA: Diagnosis not present

## 2019-12-04 DIAGNOSIS — Z6791 Unspecified blood type, Rh negative: Secondary | ICD-10-CM

## 2019-12-04 DIAGNOSIS — O26893 Other specified pregnancy related conditions, third trimester: Secondary | ICD-10-CM | POA: Diagnosis present

## 2019-12-04 DIAGNOSIS — O9902 Anemia complicating childbirth: Secondary | ICD-10-CM | POA: Diagnosis not present

## 2019-12-04 DIAGNOSIS — D649 Anemia, unspecified: Secondary | ICD-10-CM | POA: Diagnosis not present

## 2019-12-04 DIAGNOSIS — E039 Hypothyroidism, unspecified: Secondary | ICD-10-CM | POA: Diagnosis present

## 2019-12-04 DIAGNOSIS — M3500 Sicca syndrome, unspecified: Secondary | ICD-10-CM | POA: Diagnosis present

## 2019-12-04 LAB — CBC
HCT: 34.3 % — ABNORMAL LOW (ref 36.0–46.0)
Hemoglobin: 11 g/dL — ABNORMAL LOW (ref 12.0–15.0)
MCH: 26.1 pg (ref 26.0–34.0)
MCHC: 32.1 g/dL (ref 30.0–36.0)
MCV: 81.5 fL (ref 80.0–100.0)
Platelets: 242 10*3/uL (ref 150–400)
RBC: 4.21 MIL/uL (ref 3.87–5.11)
RDW: 14.7 % (ref 11.5–15.5)
WBC: 7.9 10*3/uL (ref 4.0–10.5)
nRBC: 0 % (ref 0.0–0.2)

## 2019-12-04 LAB — TYPE AND SCREEN
ABO/RH(D): O NEG
Antibody Screen: POSITIVE

## 2019-12-04 LAB — RPR: RPR Ser Ql: NONREACTIVE

## 2019-12-04 MED ORDER — PRENATAL MULTIVITAMIN CH
1.0000 | ORAL_TABLET | Freq: Every day | ORAL | Status: DC
  Administered 2019-12-05 – 2019-12-06 (×2): 1 via ORAL
  Filled 2019-12-04 (×2): qty 1

## 2019-12-04 MED ORDER — SOD CITRATE-CITRIC ACID 500-334 MG/5ML PO SOLN: 30.0000 mL | ORAL | Status: DC | PRN

## 2019-12-04 MED ORDER — ONDANSETRON HCL 4 MG/2ML IJ SOLN: 4.0000 mg | INTRAMUSCULAR | Status: DC | PRN

## 2019-12-04 MED ORDER — DIPHENHYDRAMINE HCL 25 MG PO CAPS: 25.0000 mg | ORAL_CAPSULE | Freq: Four times a day (QID) | ORAL | Status: DC | PRN

## 2019-12-04 MED ORDER — FAMOTIDINE IN NACL 20-0.9 MG/50ML-% IV SOLN
20.0000 mg | Freq: Once | INTRAVENOUS | Status: DC
  Filled 2019-12-04: qty 50

## 2019-12-04 MED ORDER — LACTATED RINGERS IV SOLN: 500.0000 mL | Freq: Once | INTRAVENOUS | Status: DC

## 2019-12-04 MED ORDER — LIDOCAINE HCL (PF) 1 % IJ SOLN
INTRAMUSCULAR | Status: DC | PRN
  Administered 2019-12-04 (×2): 4 mL via EPIDURAL

## 2019-12-04 MED ORDER — ACETAMINOPHEN 325 MG PO TABS
650.0000 mg | ORAL_TABLET | ORAL | Status: DC | PRN
  Administered 2019-12-04: 650 mg via ORAL
  Filled 2019-12-04: qty 2

## 2019-12-04 MED ORDER — ACETAMINOPHEN 325 MG PO TABS
650.0000 mg | ORAL_TABLET | ORAL | Status: DC | PRN
  Administered 2019-12-04 – 2019-12-06 (×4): 650 mg via ORAL
  Filled 2019-12-04 (×4): qty 2

## 2019-12-04 MED ORDER — SODIUM CHLORIDE (PF) 0.9 % IJ SOLN
INTRAMUSCULAR | Status: DC | PRN
  Administered 2019-12-04: 12 mL/h via EPIDURAL

## 2019-12-04 MED ORDER — LACTATED RINGERS IV SOLN: INTRAVENOUS | Status: DC

## 2019-12-04 MED ORDER — TERBUTALINE SULFATE 1 MG/ML IJ SOLN: 0.2500 mg | Freq: Once | INTRAMUSCULAR | Status: DC | PRN

## 2019-12-04 MED ORDER — WITCH HAZEL-GLYCERIN EX PADS: 1.0000 "application " | MEDICATED_PAD | CUTANEOUS | Status: DC | PRN

## 2019-12-04 MED ORDER — COCONUT OIL OIL
1.0000 "application " | TOPICAL_OIL | Status: DC | PRN
  Administered 2019-12-04: 1 via TOPICAL

## 2019-12-04 MED ORDER — LIOTHYRONINE SODIUM 5 MCG PO TABS: 40.0000 ug | ORAL_TABLET | ORAL | Status: DC

## 2019-12-04 MED ORDER — LEVOTHYROXINE SODIUM 88 MCG PO TABS: 88.0000 ug | ORAL_TABLET | ORAL | Status: DC

## 2019-12-04 MED ORDER — FAMOTIDINE 20 MG PO TABS: 20.0000 mg | ORAL_TABLET | Freq: Every day | ORAL | Status: DC | PRN

## 2019-12-04 MED ORDER — OXYTOCIN-SODIUM CHLORIDE 30-0.9 UT/500ML-% IV SOLN
2.5000 [IU]/h | INTRAVENOUS | Status: DC
  Administered 2019-12-04: 2.5 [IU]/h via INTRAVENOUS

## 2019-12-04 MED ORDER — LIOTHYRONINE SODIUM 5 MCG PO TABS: 20.0000 ug | ORAL_TABLET | ORAL | Status: DC

## 2019-12-04 MED ORDER — EPHEDRINE 5 MG/ML INJ: 10.0000 mg | INTRAVENOUS | Status: DC | PRN

## 2019-12-04 MED ORDER — DIPHENHYDRAMINE HCL 50 MG/ML IJ SOLN: 12.5000 mg | INTRAMUSCULAR | Status: DC | PRN

## 2019-12-04 MED ORDER — CEFAZOLIN SODIUM-DEXTROSE 2-4 GM/100ML-% IV SOLN
2.0000 g | Freq: Once | INTRAVENOUS | Status: AC
  Administered 2019-12-04: 2 g via INTRAVENOUS
  Filled 2019-12-04: qty 100

## 2019-12-04 MED ORDER — ONDANSETRON HCL 4 MG PO TABS
4.0000 mg | ORAL_TABLET | ORAL | Status: DC | PRN
  Administered 2019-12-05: 4 mg via ORAL
  Filled 2019-12-04: qty 1

## 2019-12-04 MED ORDER — PANTOPRAZOLE SODIUM 40 MG PO TBEC
40.0000 mg | DELAYED_RELEASE_TABLET | Freq: Every day | ORAL | Status: DC
  Administered 2019-12-04: 40 mg via ORAL
  Filled 2019-12-04: qty 1

## 2019-12-04 MED ORDER — SENNOSIDES-DOCUSATE SODIUM 8.6-50 MG PO TABS
2.0000 | ORAL_TABLET | ORAL | Status: DC
  Administered 2019-12-04 – 2019-12-05 (×2): 2 via ORAL
  Filled 2019-12-04 (×2): qty 2

## 2019-12-04 MED ORDER — PHENYLEPHRINE 40 MCG/ML (10ML) SYRINGE FOR IV PUSH (FOR BLOOD PRESSURE SUPPORT)
80.0000 ug | PREFILLED_SYRINGE | INTRAVENOUS | Status: DC | PRN
  Filled 2019-12-04: qty 10

## 2019-12-04 MED ORDER — DIBUCAINE (PERIANAL) 1 % EX OINT: 1.0000 "application " | TOPICAL_OINTMENT | CUTANEOUS | Status: DC | PRN

## 2019-12-04 MED ORDER — CEFAZOLIN (ANCEF) 1 G IV SOLR: 2.0000 g | Freq: Once | INTRAVENOUS | Status: DC

## 2019-12-04 MED ORDER — ACETAMINOPHEN 500 MG PO TABS
1000.0000 mg | ORAL_TABLET | Freq: Once | ORAL | Status: AC
  Administered 2019-12-04: 1000 mg via ORAL
  Filled 2019-12-04: qty 2

## 2019-12-04 MED ORDER — FENTANYL-BUPIVACAINE-NACL 0.5-0.125-0.9 MG/250ML-% EP SOLN
12.0000 mL/h | EPIDURAL | Status: DC | PRN
  Filled 2019-12-04: qty 250

## 2019-12-04 MED ORDER — OXYTOCIN BOLUS FROM INFUSION
333.0000 mL | Freq: Once | INTRAVENOUS | Status: AC
  Administered 2019-12-04: 333 mL via INTRAVENOUS

## 2019-12-04 MED ORDER — SIMETHICONE 80 MG PO CHEW: 80.0000 mg | CHEWABLE_TABLET | ORAL | Status: DC | PRN

## 2019-12-04 MED ORDER — MISOPROSTOL 25 MCG QUARTER TABLET
25.0000 ug | ORAL_TABLET | ORAL | Status: DC | PRN
  Administered 2019-12-04 (×2): 25 ug via VAGINAL
  Filled 2019-12-04 (×2): qty 1

## 2019-12-04 MED ORDER — IBUPROFEN 600 MG PO TABS
600.0000 mg | ORAL_TABLET | Freq: Four times a day (QID) | ORAL | Status: DC
  Administered 2019-12-04 – 2019-12-06 (×7): 600 mg via ORAL
  Filled 2019-12-04 (×7): qty 1

## 2019-12-04 MED ORDER — METHYLERGONOVINE MALEATE 0.2 MG/ML IJ SOLN: 0.2000 mg | INTRAMUSCULAR | Status: DC | PRN

## 2019-12-04 MED ORDER — OXYCODONE HCL 5 MG PO TABS
5.0000 mg | ORAL_TABLET | ORAL | Status: DC | PRN
  Administered 2019-12-05 – 2019-12-06 (×2): 5 mg via ORAL
  Filled 2019-12-04 (×2): qty 1

## 2019-12-04 MED ORDER — OXYTOCIN-SODIUM CHLORIDE 30-0.9 UT/500ML-% IV SOLN
1.0000 m[IU]/min | INTRAVENOUS | Status: DC
  Administered 2019-12-04: 2 m[IU]/min via INTRAVENOUS

## 2019-12-04 MED ORDER — METHYLERGONOVINE MALEATE 0.2 MG PO TABS: 0.2000 mg | ORAL_TABLET | ORAL | Status: DC | PRN

## 2019-12-04 MED ORDER — LEVOTHYROXINE SODIUM 88 MCG PO TABS
88.0000 ug | ORAL_TABLET | ORAL | Status: DC
  Administered 2019-12-05 – 2019-12-06 (×2): 88 ug via ORAL
  Filled 2019-12-04 (×2): qty 1

## 2019-12-04 MED ORDER — LIOTHYRONINE SODIUM 5 MCG PO TABS
20.0000 ug | ORAL_TABLET | ORAL | Status: DC
  Administered 2019-12-06: 20 ug via ORAL
  Filled 2019-12-04 (×2): qty 4

## 2019-12-04 MED ORDER — ONDANSETRON HCL 4 MG/2ML IJ SOLN
INTRAMUSCULAR | Status: AC
  Filled 2019-12-04: qty 2

## 2019-12-04 MED ORDER — TETANUS-DIPHTH-ACELL PERTUSSIS 5-2.5-18.5 LF-MCG/0.5 IM SUSP: 0.5000 mL | Freq: Once | INTRAMUSCULAR | Status: DC

## 2019-12-04 MED ORDER — FENTANYL CITRATE (PF) 100 MCG/2ML IJ SOLN
INTRAMUSCULAR | Status: AC
  Filled 2019-12-04: qty 2

## 2019-12-04 MED ORDER — METHYLERGONOVINE MALEATE 0.2 MG/ML IJ SOLN
0.2000 mg | Freq: Once | INTRAMUSCULAR | Status: AC
  Administered 2019-12-04: 0.2 mg via INTRAMUSCULAR

## 2019-12-04 MED ORDER — FENTANYL CITRATE (PF) 100 MCG/2ML IJ SOLN
50.0000 ug | INTRAMUSCULAR | Status: DC | PRN
  Administered 2019-12-04: 100 ug via INTRAVENOUS

## 2019-12-04 MED ORDER — LEVOTHYROXINE SODIUM 88 MCG PO TABS: 176.0000 ug | ORAL_TABLET | ORAL | Status: DC

## 2019-12-04 MED ORDER — LIDOCAINE HCL (PF) 1 % IJ SOLN: 30.0000 mL | INTRAMUSCULAR | Status: DC | PRN

## 2019-12-04 MED ORDER — PHENYLEPHRINE 40 MCG/ML (10ML) SYRINGE FOR IV PUSH (FOR BLOOD PRESSURE SUPPORT): 80.0000 ug | PREFILLED_SYRINGE | INTRAVENOUS | Status: DC | PRN

## 2019-12-04 MED ORDER — OXYTOCIN-SODIUM CHLORIDE 30-0.9 UT/500ML-% IV SOLN
INTRAVENOUS | Status: AC
  Filled 2019-12-04: qty 500

## 2019-12-04 MED ORDER — BENZOCAINE-MENTHOL 20-0.5 % EX AERO
1.0000 "application " | INHALATION_SPRAY | CUTANEOUS | Status: DC | PRN
  Administered 2019-12-04: 1 via TOPICAL
  Filled 2019-12-04: qty 56

## 2019-12-04 MED ORDER — ONDANSETRON HCL 4 MG/2ML IJ SOLN
4.0000 mg | Freq: Four times a day (QID) | INTRAMUSCULAR | Status: DC | PRN
  Administered 2019-12-04: 4 mg via INTRAVENOUS

## 2019-12-04 MED ORDER — PANTOPRAZOLE SODIUM 40 MG PO TBEC: 40.0000 mg | DELAYED_RELEASE_TABLET | Freq: Every day | ORAL | Status: DC

## 2019-12-04 MED ORDER — LACTATED RINGERS IV SOLN: 500.0000 mL | INTRAVENOUS | Status: DC | PRN

## 2019-12-04 MED ORDER — OXYCODONE HCL 5 MG PO TABS: 10.0000 mg | ORAL_TABLET | ORAL | Status: DC | PRN

## 2019-12-04 MED ORDER — PANTOPRAZOLE SODIUM 40 MG PO TBEC
40.0000 mg | DELAYED_RELEASE_TABLET | Freq: Every day | ORAL | Status: DC
  Administered 2019-12-05 – 2019-12-06 (×2): 40 mg via ORAL
  Filled 2019-12-04 (×2): qty 1

## 2019-12-04 MED ORDER — METHYLERGONOVINE MALEATE 0.2 MG/ML IJ SOLN
INTRAMUSCULAR | Status: AC
  Filled 2019-12-04: qty 1

## 2019-12-04 NOTE — Consult Note (Signed)
Delivery team called to attend this vaginal delivery for MSAF.  Infant delivered and came out crying vigorously so team dismissed by Dr. Harrington Challenger and L&D staff.    Amedeo Gory, MD (Attending Neonatologist)

## 2019-12-04 NOTE — Anesthesia Procedure Notes (Signed)
Epidural Patient location during procedure: OB Start time: 12/04/2019 10:29 AM End time: 12/04/2019 10:37 AM  Staffing Anesthesiologist: Josephine Igo, MD Performed: anesthesiologist   Preanesthetic Checklist Completed: patient identified, IV checked, site marked, risks and benefits discussed, surgical consent, monitors and equipment checked, pre-op evaluation and timeout performed  Epidural Patient position: sitting Prep: DuraPrep and site prepped and draped Patient monitoring: continuous pulse ox and blood pressure Approach: midline Location: L3-L4 Injection technique: LOR air  Needle:  Needle type: Tuohy  Needle gauge: 17 G Needle length: 9 cm and 9 Needle insertion depth: 6 cm Catheter type: closed end flexible Catheter size: 19 Gauge Catheter at skin depth: 11 cm Test dose: negative  Assessment Events: blood not aspirated, injection not painful, no injection resistance, no paresthesia and negative IV test  Additional Notes Patient identified. Risks and benefits discussed including failed block, incomplete  Pain control, post dural puncture headache, nerve damage, paralysis, blood pressure Changes, nausea, vomiting, reactions to medications-both toxic and allergic and post Partum back pain. All questions were answered. Patient expressed understanding and wished to proceed. Sterile technique was used throughout procedure. Epidural site was Dressed with sterile barrier dressing. No paresthesias, signs of intravascular injection Or signs of intrathecal spread were encountered.  Patient was more comfortable after the epidural was dosed. Please see RN's note for documentation of vital signs and FHR which are stable. Reason for block:procedure for pain

## 2019-12-04 NOTE — Anesthesia Preprocedure Evaluation (Signed)
Anesthesia Evaluation  Patient identified by MRN, date of birth, ID band Patient awake    Reviewed: Allergy & Precautions, NPO status , Patient's Chart, lab work & pertinent test results  Airway Mallampati: II  TM Distance: >3 FB Neck ROM: Full    Dental no notable dental hx. (+) Teeth Intact   Pulmonary neg pulmonary ROS,    Pulmonary exam normal breath sounds clear to auscultation       Cardiovascular negative cardio ROS Normal cardiovascular exam Rhythm:Regular Rate:Normal     Neuro/Psych negative neurological ROS  negative psych ROS   GI/Hepatic Neg liver ROS, GERD  Medicated and Controlled,  Endo/Other  Hypothyroidism Hyperthyroidism Grave's disease  Renal/GU negative Renal ROS  negative genitourinary   Musculoskeletal Scleroderma   Abdominal   Peds  Hematology  (+) anemia ,   Anesthesia Other Findings   Reproductive/Obstetrics (+) Pregnancy                             Anesthesia Physical Anesthesia Plan  ASA: II  Anesthesia Plan: Epidural   Post-op Pain Management:    Induction: Intravenous  PONV Risk Score and Plan:   Airway Management Planned: Natural Airway  Additional Equipment:   Intra-op Plan:   Post-operative Plan:   Informed Consent: I have reviewed the patients History and Physical, chart, labs and discussed the procedure including the risks, benefits and alternatives for the proposed anesthesia with the patient or authorized representative who has indicated his/her understanding and acceptance.       Plan Discussed with: Anesthesiologist  Anesthesia Plan Comments:         Anesthesia Quick Evaluation

## 2019-12-04 NOTE — H&P (Signed)
Casara L Decamp is a 34 y.o. female presenting for IOL for IVF pregnancy  34 yo G2P0010 @ 39+5 presents for IOL d/t IVF. Her pregnancy has been complicated by Hypothyroidism d/t thyroidectomy, Sjogrens syndrome, Rh negative, scleroderma.  OB History    Gravida  2   Para      Term      Preterm      AB  1   Living  0     SAB      TAB      Ectopic  1   Multiple      Live Births             Past Medical History:  Diagnosis Date  . Graves disease   . Hypothyroidism   . Scleroderma ([place]) 1995   Past Surgical History:  Procedure Laterality Date  . egg retreival    . FACIAL COSMETIC SURGERY     eye lift both eyes  . LAPAROSCOPY     lysis of adhesions, left salpingectomy myomectomy, right fimbrioplasty, chromotubation  . SALPINGECTOMY Left 10/2018  . THYROIDECTOMY  2010   Family History: family history includes Diabetes in her father. Social History:  reports that she has never smoked. She has never used smokeless tobacco. She reports that she does not drink alcohol and does not use drugs.     Maternal Diabetes: No Genetic Screening: Normal PGD on embryo reportedly 46XY Maternal Ultrasounds/Referrals: Other: Fetal Ultrasounds or other Referrals:  Fetal echo Normal Maternal Substance Abuse:  No Significant Maternal Medications:  Meds include: Other: Omeprazole, levothyroxine,  57mcg, liothyrine 20mg  Mon-Sat/15mg  Sun, choline 10mg , famotidine 20mg  Significant Maternal Lab Results:  Rh negative Other Comments:  None  Review of Systems History Dilation: 3 Effacement (%): 50 Station: -2 Exam by:: Dorris Pierre Blood pressure 113/75, pulse 63, temperature 98.1 F (36.7 C), temperature source Oral, resp. rate 16, height 5\' 3"  (1.6 m), weight 76 kg. Exam Physical Exam  Prenatal labs: ABO, Rh: --/--/O NEG (06/29 0044) Antibody: POS (06/29 0044) Rubella: Immune (12/16 0000) RPR: Nonreactive (12/16 0000)  HBsAg: Negative (12/16 0000)  HIV: Non-reactive (12/16 0000)   GBS: Negative/-- (06/06 0000)   Assessment/Plan: 1) Admit 2) Misoprostal 28mcg Q 4, pit 3) AROM when able 5) Epidural on request   Vanessa Kick 12/04/2019, 9:30 AM

## 2019-12-05 LAB — CBC
HCT: 24.7 % — ABNORMAL LOW (ref 36.0–46.0)
Hemoglobin: 8 g/dL — ABNORMAL LOW (ref 12.0–15.0)
MCH: 26.6 pg (ref 26.0–34.0)
MCHC: 32.4 g/dL (ref 30.0–36.0)
MCV: 82.1 fL (ref 80.0–100.0)
Platelets: 179 10*3/uL (ref 150–400)
RBC: 3.01 MIL/uL — ABNORMAL LOW (ref 3.87–5.11)
RDW: 14.9 % (ref 11.5–15.5)
WBC: 10.8 10*3/uL — ABNORMAL HIGH (ref 4.0–10.5)
nRBC: 0 % (ref 0.0–0.2)

## 2019-12-05 MED ORDER — FERROUS GLUCONATE 324 (38 FE) MG PO TABS
324.0000 mg | ORAL_TABLET | Freq: Two times a day (BID) | ORAL | Status: DC
  Administered 2019-12-05 – 2019-12-06 (×3): 324 mg via ORAL
  Filled 2019-12-05 (×4): qty 1

## 2019-12-05 NOTE — Anesthesia Postprocedure Evaluation (Signed)
Anesthesia Post Note  Patient: Melinda Zimmerman  Procedure(s) Performed: AN AD HOC LABOR EPIDURAL     Patient location during evaluation: Mother Baby Anesthesia Type: Epidural Level of consciousness: awake and alert Pain management: pain level controlled Vital Signs Assessment: post-procedure vital signs reviewed and stable Respiratory status: spontaneous breathing Cardiovascular status: stable Postop Assessment: no headache, adequate PO intake, no backache, patient able to bend at knees, able to ambulate, epidural receding and no apparent nausea or vomiting Anesthetic complications: no   No complications documented.  Last Vitals:  Vitals:   12/05/19 0330 12/05/19 0745  BP: 110/70 105/72  Pulse: 72 75  Resp: 16 18  Temp: 36.8 C 36.7 C  SpO2: 100% 100%    Last Pain:  Vitals:   12/05/19 0745  TempSrc: Oral  PainSc: 3    Pain Goal: Patients Stated Pain Goal: 3 (12/05/19 0745)              Epidural/Spinal Function Cutaneous sensation: Normal sensation (12/05/19 0745)  Ailene Ards

## 2019-12-05 NOTE — Progress Notes (Signed)
Patient is eating, ambulating, voiding.  Pain control is good.  Appropriate lochia, discomfort and pressure at vagina.  Vitals:   12/04/19 2200 12/04/19 2328 12/05/19 0330 12/05/19 0745  BP: 100/72 108/77 110/70 105/72  Pulse: 82 72 72 75  Resp: 16 15 16 18   Temp: 98 F (36.7 C) 98.1 F (36.7 C) 98.2 F (36.8 C) 98 F (36.7 C)  TempSrc: Oral Oral Oral Oral  SpO2: 97% 98% 100% 100%  Weight:      Height:        Fundus firm, normal lochia on pad Abd: soft, nontender Ext: no calf tenderness  Lab Results  Component Value Date   WBC 10.8 (H) 12/05/2019   HGB 8.0 (L) 12/05/2019   HCT 24.7 (L) 12/05/2019   MCV 82.1 12/05/2019   PLT 179 12/05/2019    --/--/O NEG (06/29 0044)  A/P Post partum day 1. Circ desired, baby not yet examined, not voided and per RN feeding poorly, will delay until tomorrow Anemia: PPH, s/p methergine x 1Fe bid Intrapartum fever s/p Ancef x 1 dose, no further fevers, abd nontender  Routine care.  Expect d/c 7/1.    Allyn Kenner

## 2019-12-05 NOTE — Lactation Note (Signed)
This note was copied from a baby's chart. Lactation Consultation Note  Patient Name: Melinda Zimmerman Date: 12/05/2019  [name] Melinda Zimmerman now 30 hours old.   LC entered room.  Infant on left breast in football.  Without nipple shield.  Infant came off and parents trying to put him back on the breast. Urged parents to get him in chin first by nose to nipple   Showed dad how to keep his hand at the ears and neck and not latch him from holding his head. Urged dad to let him see if infant would start back on him own when he paused.  Urged dad to give him a few seconds.  Explained to dad he was supposed to pause.  Showed dad how to would give him enough room to swallow and breathe.  And that mom should not have to pull tissue away from his nose if he is positioned correctly.After about 5 more minutes of breastfeeding parents broke suction and took him off.  Nipple slightly compressed. Mom reports she is ready to pump.  Stayed with mom duration of pumping and demo hand expression.  Mom able to demo back and gave a few drops to infant and rubbed expressed mothers milk on nipples.  Urged to air dry before placing comfort gels. Offered to wash pump parts.  Dad declined.  Urged to call lactation as needed.  Maternal Data    Feeding Feeding Type: Breast Fed  LATCH Score Latch: Grasps breast easily, tongue down, lips flanged, rhythmical sucking.  Audible Swallowing: Spontaneous and intermittent  Type of Nipple: Everted at rest and after stimulation  Comfort (Breast/Nipple): Filling, red/small blisters or bruises, mild/mod discomfort  Hold (Positioning): Assistance needed to correctly position infant at breast and maintain latch.  LATCH Score: 8  Interventions Interventions: Breast feeding basics reviewed;Assisted with latch;Skin to skin;Breast massage;Hand express;Pre-pump if needed;Adjust position;Hand pump  Lactation Tools Discussed/Used Tools: Pump;Comfort gels;Shells Breast pump  type: Manual   Consult Status      Melinda Zimmerman 12/05/2019, 8:09 PM

## 2019-12-05 NOTE — Lactation Note (Signed)
This note was copied from a baby's chart. Lactation Consultation Note  Patient Name: Melinda Zimmerman [id number] Date: 12/05/2019   [name] now 60 hours old getting bathed.  Urged parents to call  If wanted assistance if infant started cuing with STS past bath. Parents in agreement.    Maternal Data    Feeding    LATCH Score                   Interventions    Lactation Tools Discussed/Used     Consult Status      Noris Kulinski Thompson Caul 12/05/2019, 6:45 PM

## 2019-12-05 NOTE — Lactation Note (Signed)
This note was copied from a baby's chart. Lactation Consultation Note Attempted to see mom, mom sleeping. Patient Name: Melinda Zimmerman [id number] Date: 12/05/2019     Maternal Data    Feeding Feeding Type: Breast Fed  LATCH Score Latch: Repeated attempts needed to sustain latch, nipple held in mouth throughout feeding, stimulation needed to elicit sucking reflex.  Audible Swallowing: A few with stimulation  Type of Nipple: Everted at rest and after stimulation  Comfort (Breast/Nipple): Filling, red/small blisters or bruises, mild/mod discomfort  Hold (Positioning): Full assist, staff holds infant at breast  LATCH Score: 5  Interventions    Lactation Tools Discussed/Used     Consult Status      Melinda Zimmerman 12/05/2019, 1:30 AM

## 2019-12-05 NOTE — Lactation Note (Signed)
This note was copied from a baby's chart. Lactation Consultation Note  Patient Name: Melinda Zimmerman [id number] Date: 12/05/2019 Reason for consult: Follow-up assessment;1st time breastfeeding;Primapara;Term;Infant weight loss  MBURN Bettsy McKinnon reported to this [facility] mom has having challenges due to sore nipples and may need a Nipple Shield.  LC entered the room at 1340 and baby was latched on the right breast / football with the [facility] assistance.  LC noted baby was latched with firm support and depth , swallows noted and LC recommended to mom to do intermittent compressions while the baby is feeding.  Baby released on is own and nipple noted to be slightly slanted, and small blood blister with bleeding small amount.  Both nipples sore in appearance and both areolas has bruising / especially on the right.  Baby rested on moms chest and was dozing and woke back up 10- 15 mins later and  Due to soreness on the left breast applied a nipple shield #24 and latched the baby and per  Mom the comfort had improved but still noticeable.  Baby released and didn't seem interested in feeding.  Mom held the baby and he started rooting , dad had brought colostrum from and LC helped to finger feed the baby 4 ml of EBM.  LC recommended after comfort gels cool off to pump both breast for 15 -20 mins . / save milk for the next feeding. Mom has shells , comfort gels , hand pump , #24 NS and DEBP set up.  LC recommended for sore nipples - give the right a break and feed on the left with a Nipple Shield #24 and if EBM available instill in the top and latch the baby,  Finger any EBM available and if not consider supplementing with donor milk ( the [facility] gave mom and dad consent to read ) undecided as of yet.  After baby is settled post pump both breast for 15 - 20 mins / save milk.  Once the right is improved with soreness - start latching on the right.  Per mom will have a [place] .   Mom requested another  [place] visit this pm and LC encouraged mom to call with feeding cues  For LC .  LC gave the evening consultant report and mentioned moms request and if she didn't call to check on her due to sore nipples.    Maternal Data Has patient been taught Hand Expression?: Yes Does the patient have breastfeeding experience prior to this delivery?: No  Feeding Feeding Type: Breast Milk  LATCH Score Latch: Grasps breast easily, tongue down, lips flanged, rhythmical sucking.  Audible Swallowing: None  Type of Nipple: Everted at rest and after stimulation  Comfort (Breast/Nipple): Soft / non-tender  Hold (Positioning): Assistance needed to correctly position infant at breast and maintain latch.  LATCH Score: 7  Interventions Interventions: Breast feeding basics reviewed  Lactation Tools Discussed/Used Tools: Shells;Pump;Flanges;Nipple Shields Nipple shield size: 24 Flange Size: 24 Shell Type: Inverted Breast pump type: Manual;Double-Electric Breast Pump WIC Program: No   Consult Status Consult Status: Follow-up Date: 12/05/19 Follow-up type: In-patient    [facility] 12/05/2019, 4:01 PM

## 2019-12-06 ENCOUNTER — Inpatient Hospital Stay (HOSPITAL_COMMUNITY): Admission: RE | Admit: 2019-12-06 | Payer: 59 | Source: Home / Self Care

## 2019-12-06 DIAGNOSIS — D62 Acute posthemorrhagic anemia: Secondary | ICD-10-CM

## 2019-12-06 MED ORDER — MUPIROCIN 2 % EX OINT: 1.0000 "application " | TOPICAL_OINTMENT | Freq: Two times a day (BID) | CUTANEOUS | 0 refills | Status: DC

## 2019-12-06 MED ORDER — BETAMETHASONE VALERATE 0.1 % EX OINT
1.0000 | TOPICAL_OINTMENT | Freq: Two times a day (BID) | CUTANEOUS | 0 refills | Status: DC
Start: 2019-12-06 — End: 2022-12-31

## 2019-12-06 MED ORDER — IBUPROFEN 800 MG PO TABS
800.0000 mg | ORAL_TABLET | Freq: Three times a day (TID) | ORAL | 1 refills | Status: DC | PRN
Start: 2019-12-06 — End: 2022-12-31

## 2019-12-06 MED ORDER — ACETAMINOPHEN 325 MG PO TABS: 650.0000 mg | ORAL_TABLET | ORAL | 1 refills | Status: AC | PRN

## 2019-12-06 MED ORDER — DOCUSATE SODIUM 50 MG PO CAPS: 50.0000 mg | ORAL_CAPSULE | Freq: Two times a day (BID) | ORAL | 0 refills | Status: AC

## 2019-12-06 MED ORDER — FERROUS GLUCONATE 324 (38 FE) MG PO TABS: 324.0000 mg | ORAL_TABLET | Freq: Two times a day (BID) | ORAL | 3 refills | Status: AC

## 2019-12-06 MED ORDER — OXYCODONE-ACETAMINOPHEN 5-325 MG PO TABS: 1.0000 | ORAL_TABLET | Freq: Four times a day (QID) | ORAL | 0 refills | Status: DC | PRN

## 2019-12-06 MED ORDER — NYSTATIN-TRIAMCINOLONE 100000-0.1 UNIT/GM-% EX OINT
1.0000 | TOPICAL_OINTMENT | Freq: Two times a day (BID) | CUTANEOUS | 0 refills | Status: DC
Start: 2019-12-06 — End: 2022-12-31

## 2019-12-06 MED FILL — IBUPROFEN 800 MG TAB: 800 | 10 days supply | Qty: 30 | Fill #0

## 2019-12-06 MED FILL — OXYCODONE-APAP 5-325MG: 5-325 | 2 days supply | Qty: 10 | Fill #0

## 2019-12-06 NOTE — Discharge Summary (Signed)
Postpartum Discharge Summary    Patient Name: Melinda Zimmerman DOB: 1986-05-30 MRN: [id number]  Date of admission: 12/04/2019 Delivery date:12/04/2019  Delivering provider: Vanessa Kick  Date of discharge: 12/06/2019  Admitting diagnosis: [redacted] weeks gestation of pregnancy [Z3A.39] Spontaneous vaginal delivery [O80] Intrauterine pregnancy: [redacted]w[redacted]d    Secondary diagnosis:  Active Problems:   [redacted] weeks gestation of pregnancy   Spontaneous vaginal delivery   Acute blood loss anemia  Additional problems: none    Discharge diagnosis: Term Pregnancy Delivered and [place]                                             Post partum procedures:none Augmentation: AROM, Pitocin and Cytotec Complications: None  Hospital course: Induction of Labor With Vaginal Delivery   34y.o. yo G2P1011 at 385w5das admitted to the hospital 12/04/2019 for induction of labor.  Indication for induction: IVF.  Patient had an uncomplicated labor course as follows: Membrane Rupture Time/Date: 9:13 AM ,12/04/2019   Delivery Method:Vaginal, Spontaneous  Episiotomy: None  Lacerations:  2nd degree  Details of delivery can be found in separate delivery note. Uterine atony at delivery with EBL 862cc, Hgb 11>8, received methergine. On PP patient had appropriate bleeding and Rx PO iron. Patient had a routine postpartum course. Patient is discharged home 12/06/19.  Newborn Data: Birth date:12/04/2019  Birth time:7:15 PM  Gender:Female  Living status:Living  Apgars:8 ,9  Weight:3666 g   Magnesium Sulfate received: No BMZ received: No Rhophylac:No MMR:N/A T-DaP:Given prenatally Flu: Yes 03/29/2019 Transfusion:No  Physical exam  Vitals:   12/05/19 1227 12/05/19 1542 12/05/19 2108 12/06/19 0528  BP: 98/71 94/64 106/78 102/61  Pulse: 76 77 73 66  Resp: 18 18 18 18   Temp: 97.7 F (36.5 C) 97.9 F (36.6 C) 97.8 F (36.6 C) 97.8 F (36.6 C)  TempSrc: Oral Oral Oral Oral  SpO2:  98%  100%  Weight:      Height:        General: alert and no distress Lochia: appropriate Uterine Fundus: firm DVT Evaluation: No evidence of DVT seen on physical exam. Labs: Lab Results  Component Value Date   WBC 10.8 (H) 12/05/2019   HGB 8.0 (L) 12/05/2019   HCT 24.7 (L) 12/05/2019   MCV 82.1 12/05/2019   PLT 179 12/05/2019   CMP Latest Ref Rng & Units 06/27/2010  Glucose 70 - 99 mg/dL -  BUN 6 - 23 mg/dL -  Creatinine 0.40 - 1.20 mg/dL -  Sodium 135 - 145 mEq/L -  Potassium 3.5 - 5.1 mEq/L -  Chloride 96 - 112 mEq/L -  CO2 19 - 32 mEq/L -  Calcium 8.4 - 10.5 mg/dL 9.2    After visit meds:  Allergies as of 12/06/2019      Reactions   Iodine Rash      Medication List    TAKE these medications   acetaminophen 325 MG tablet Commonly known as: Tylenol Take 2 tablets (650 mg total) by mouth every 4 (four) hours as needed (for pain scale < 4).   betamethasone valerate ointment 0.1 % Commonly known as: VALISONE Apply 1 application topically 2 (two) times daily. Triple nipple cream - apply to sore nipples   docusate sodium 50 MG capsule Commonly known as: COLACE Take 1 capsule (50 mg total) by mouth 2 (two) times daily.  esomeprazole 40 MG capsule Commonly known as: NEXIUM Take 40 mg by mouth in the morning and at bedtime.   famotidine 20 MG tablet Commonly known as: PEPCID Take 20 mg by mouth daily as needed for heartburn or indigestion.   ferrous gluconate 324 MG tablet Commonly known as: FERGON Take 1 tablet (324 mg total) by mouth 2 (two) times daily with a meal.   ibuprofen 800 MG tablet Commonly known as: ADVIL Take 1 tablet (800 mg total) by mouth every 8 (eight) hours as needed.   levothyroxine 88 MCG tablet Commonly known as: SYNTHROID Take 88 mcg by mouth See admin instructions. Take 88 mcg by mouth Monday-Friday and take 176 mcg on Saturday and Sundays   liothyronine 5 MCG tablet Commonly known as: CYTOMEL Take 20-40 mcg by mouth See admin instructions. Take 20 mcg by mouth  Monday-Friday and take 40 mcg on Saturday and Sunday   multivitamin-prenatal 27-0.8 MG Tabs tablet Take 1 tablet by mouth daily at 12 noon.   mupirocin ointment 2 % Commonly known as: Bactroban Place 1 application into the nose 2 (two) times daily.   nystatin-triamcinolone ointment Commonly known as: MYCOLOG Apply 1 application topically 2 (two) times daily.   oxyCODONE-acetaminophen 5-325 MG tablet Commonly known as: Percocet Take 1 tablet by mouth every 6 (six) hours as needed for severe pain.   promethazine 12.5 MG tablet Commonly known as: PHENERGAN Take 12.5 mg by mouth every 6 (six) hours as needed for nausea or vomiting.        Discharge home in stable condition Infant Feeding: Breast Infant Disposition:home with mother Discharge instruction: per After Visit Summary and Postpartum booklet. Activity: Advance as tolerated. Pelvic rest for 6 weeks.  Diet: routine diet Anticipated Birth Control: Unsure Postpartum Appointment:4 weeks Additional Postpartum F/U: none Future Appointments:No future appointments. Follow up Visit:  Follow-up Information    Vanessa Kick, MD. Schedule an appointment as soon as possible for a visit in 4 week(s).   Specialty: Obstetrics and Gynecology Contact information: [address] [place] [address] [phone]                   12/06/2019 Jonelle Sidle, MD

## 2019-12-06 NOTE — Progress Notes (Signed)
Post Partum Day 2 Subjective: Overall feeling sore, notes rash on back from adhesive tape from epidural. Feeling pelvic pressure. Tolerating PO without nausea/vomiting. Voiding spontaneously. Pain management improved with oxycodone.  Objective: Patient Vitals for the past 24 hrs:  BP Temp Temp src Pulse Resp SpO2  12/06/19 0528 102/61 97.8 F (36.6 C) Oral 66 18 100 %  12/05/19 2108 106/78 97.8 F (36.6 C) Oral 73 18 --  12/05/19 1542 94/64 97.9 F (36.6 C) Oral 77 18 98 %  12/05/19 1227 98/71 97.7 F (36.5 C) Oral 76 18 --    Physical Exam:  General: alert and no distress Lochia: appropriate Uterine Fundus: firm DVT Evaluation: No evidence of DVT seen on physical exam.  Recent Labs    12/04/19 0044 12/05/19 0555  WBC 7.9 10.8*  HGB 11.0* 8.0*  HCT 34.3* 24.7*  PLT 242 179   Assessment/Plan: Discharge home  Melinda Zimmerman 34 y.o. G2P1011 PPD#2 sp TSVD 1. ABLA: Hgb 11>8, EBL 862, uterine atony at delivery, s/p methergine IM. Plan for DC with PO iron 2. Rh neg, baby also O neg. Rhogam not applicable.  3. Desires neonatal circumcision, R/B/A of procedure discussed at length. Pt understands that neonatal circumcision is not considered medically necessary and is elective. The risks include, but are not limited to bleeding, infection, damage to the penis, development of scar tissue, and having to have it redone at a later date. Pt understands theses risks and wishes to proceed Rubella immune, breastfeeding, baby boy, in room - circumcision today   LOS: 2 days   Cyrilla Durkin K Taam-Akelman 12/06/2019, 8:56 AM

## 2019-12-06 NOTE — Lactation Note (Signed)
This note was copied from a baby's chart. Lactation Consultation Note  Patient Name: Melinda Zimmerman [id number] Date: 12/06/2019 Reason for consult: Follow-up assessment;Primapara;1st time breastfeeding;Term;Infant weight loss  Baby is 39 hours old / 7 % weight loss / 6.3 at 34 hours.  Baby is post circ and Nursery RN brought the baby back to the room and baby was wide awake and  Rooting to feed.  LC offered to assist mom and dad with the latch .  Prior to the latch mom showed the [facility] her breast tissue and sore nipples , LC noted the nipples were less red  but still bruised and per mom the nipples seem less sore, and the nipple Shield baby didn't seem to like it.  LC assisted to latch on / football position and fed both breast  With depth and increased swallows with moist warm compress on the breast for both . Baby fed for 20 mins the 1st breast and 15 mins the 2nd breast with increased swallows and per mom comfortable.  Firms support used for both sides and nipple well rounded when baby released. Hand expressing and pre-pumping prior to latch on the left side and more swallowed noted.  LC reviewed the importance of STS feedings until the baby is back to birth weight, gaining steadily and can stay awake for a majority of the feeding.  Discussed nutritive vs non - nutritive feeding patterns and the importance of watching the baby for  Hanging out latched.  Sore nipples and engorgement prevention and tx reviewed.  Mom has the hand pump and DEBP , shells, comfort gels, and #24 NS if needed.  Per mom has a DEBP [place] at home .  Per dad plans to call the [facility] whom works with [facility] for F/U if needed.   [place] praised mom for her breast feeding efforts and dad for his support.     Maternal Data Has patient been taught Hand Expression?: Yes  Feeding Feeding Type: Breast Fed  LATCH Score Latch: Grasps breast easily, tongue down, lips flanged, rhythmical sucking.  Audible Swallowing:  Spontaneous and intermittent  Type of Nipple: Everted at rest and after stimulation  Comfort (Breast/Nipple): Filling, red/small blisters or bruises, mild/mod discomfort  Hold (Positioning): Assistance needed to correctly position infant at breast and maintain latch.  LATCH Score: 8  Interventions Interventions: Breast feeding basics reviewed;Assisted with latch;Skin to skin;Breast massage;Hand express;Pre-pump if needed;Breast compression;Adjust position;Support pillows;Position options  Lactation Tools Discussed/Used Tools: Shells;Pump;Flanges;Coconut oil;Comfort gels Flange Size: 24;27 Shell Type: Inverted Breast pump type: Double-Electric Breast Pump;Manual WIC Program: No Pump Review: Setup, frequency, and cleaning;Milk Storage   Consult Status Consult Status: Follow-up Date: 12/06/19 Follow-up type: [place] 12/06/2019, 12:09 PM

## 2019-12-06 NOTE — Discharge Instructions (Signed)
Prescriptions Motrin 800mg  every 8 hours for pain Acetaminophen 650mg  every 6 hours for moderate pain Percocet (Oxycodone 5mg -acetaminophen 325mg ) every 4 hours for severe pain.  Make sure to not exceed acetaminophen 3000mg  every day.  Postpartum Care After Vaginal Delivery This sheet gives you information about how to care for yourself from the time you deliver your baby to up to 6-12 weeks after delivery (postpartum period). Your health care provider may also give you more specific instructions. If you have problems or questions, contact your health care provider. Follow these instructions at home: Vaginal bleeding  It is normal to have vaginal bleeding (lochia) after delivery. Wear a sanitary pad for vaginal bleeding and discharge. ? During the first week after delivery, the amount and appearance of lochia is often similar to a menstrual period. ? Over the next few weeks, it will gradually decrease to a dry, yellow-brown discharge. ? For most women, lochia stops completely by 4-6 weeks after delivery. Vaginal bleeding can vary from woman to woman.  Change your sanitary pads frequently. Watch for any changes in your flow, such as: ? A sudden increase in volume. ? A change in color. ? Large blood clots.  If you pass a blood clot from your vagina, save it and call your health care provider to discuss. Do not flush blood clots down the toilet before talking with your health care provider.  Do not use tampons or douches until your health care provider says this is safe.  If you are not breastfeeding, your period should return 6-8 weeks after delivery. If you are feeding your child breast milk only (exclusive breastfeeding), your period may not return until you stop breastfeeding. Perineal care  Keep the area between the vagina and the anus (perineum) clean and dry as told by your health care provider. Use medicated pads and pain-relieving sprays and creams as directed.  If you had a cut  in the perineum (episiotomy) or a tear in the vagina, check the area for signs of infection until you are healed. Check for: ? More redness, swelling, or pain. ? Fluid or blood coming from the cut or tear. ? Warmth. ? Pus or a bad smell.  You may be given a squirt bottle to use instead of wiping to clean the perineum area after you go to the bathroom. As you start healing, you may use the squirt bottle before wiping yourself. Make sure to wipe gently.  To relieve pain caused by an episiotomy, a tear in the vagina, or swollen veins in the anus (hemorrhoids), try taking a warm sitz bath 2-3 times a day. A sitz bath is a warm water bath that is taken while you are sitting down. The water should only come up to your hips and should cover your buttocks. Breast care  Within the first few days after delivery, your breasts may feel heavy, full, and uncomfortable (breast engorgement). Milk may also leak from your breasts. Your health care provider can suggest ways to help relieve the discomfort. Breast engorgement should go away within a few days.  If you are breastfeeding: ? Wear a bra that supports your breasts and fits you well. ? Keep your nipples clean and dry. Apply creams and ointments as told by your health care provider. ? You may need to use breast pads to absorb milk that leaks from your breasts. ? You may have uterine contractions every time you breastfeed for up to several weeks after delivery. Uterine contractions help your uterus return to  its normal size. ? If you have any problems with breastfeeding, work with your health care provider or Science writer.  If you are not breastfeeding: ? Avoid touching your breasts a lot. Doing this can make your breasts produce more milk. ? Wear a good-fitting bra and use cold packs to help with swelling. ? Do not squeeze out (express) milk. This causes you to make more milk. Intimacy and sexuality  Ask your health care provider when you can  engage in sexual activity. This may depend on: ? Your risk of infection. ? How fast you are healing. ? Your comfort and desire to engage in sexual activity.  You are able to get pregnant after delivery, even if you have not had your period. If desired, talk with your health care provider about methods of birth control (contraception). Medicines  Take over-the-counter and prescription medicines only as told by your health care provider.  If you were prescribed an antibiotic medicine, take it as told by your health care provider. Do not stop taking the antibiotic even if you start to feel better. Activity  Gradually return to your normal activities as told by your health care provider. Ask your health care provider what activities are safe for you.  Rest as much as possible. Try to rest or take a nap while your baby is sleeping. Eating and drinking   Drink enough fluid to keep your urine pale yellow.  Eat high-fiber foods every day. These may help prevent or relieve constipation. High-fiber foods include: ? Whole grain cereals and breads. ? Brown rice. ? Beans. ? Fresh fruits and vegetables.  Do not try to lose weight quickly by cutting back on calories.  Take your prenatal vitamins until your postpartum checkup or until your health care provider tells you it is okay to stop. Lifestyle  Do not use any products that contain nicotine or tobacco, such as cigarettes and e-cigarettes. If you need help quitting, ask your health care provider.  Do not drink alcohol, especially if you are breastfeeding. General instructions  Keep all follow-up visits for you and your baby as told by your health care provider. Most women visit their health care provider for a postpartum checkup within the first 3-6 weeks after delivery. Contact a health care provider if:  You feel unable to cope with the changes that your child brings to your life, and these feelings do not go away.  You feel unusually  sad or worried.  Your breasts become red, painful, or hard.  You have a fever.  You have trouble holding urine or keeping urine from leaking.  You have little or no interest in activities you used to enjoy.  You have not breastfed at all and you have not had a menstrual period for 12 weeks after delivery.  You have stopped breastfeeding and you have not had a menstrual period for 12 weeks after you stopped breastfeeding.  You have questions about caring for yourself or your baby.  You pass a blood clot from your vagina. Get help right away if:  You have chest pain.  You have difficulty breathing.  You have sudden, severe leg pain.  You have severe pain or cramping in your lower abdomen.  You bleed from your vagina so much that you fill more than one sanitary pad in one hour. Bleeding should not be heavier than your heaviest period.  You develop a severe headache.  You faint.  You have blurred vision or spots in your vision.  You have bad-smelling vaginal discharge.  You have thoughts about hurting yourself or your baby. If you ever feel like you may hurt yourself or others, or have thoughts about taking your own life, get help right away. You can go to the nearest emergency department or call:  Your local emergency services (911 in the [place]  A suicide crisis helpline, such as the [facility] at 773-558-2895. This is open 24 hours a day. Summary  The period of time right after you deliver your newborn up to 6-12 weeks after delivery is called the postpartum period.  Gradually return to your normal activities as told by your health care provider.  Keep all follow-up visits for you and your baby as told by your health care provider. This information is not intended to replace advice given to you by your health care provider. Make sure you discuss any questions you have with your health care provider. Document Revised: 05/27/2017 Document  Reviewed: 03/07/2017 Elsevier Patient Education  2020 [employer].

## 2019-12-06 NOTE — Progress Notes (Signed)
Notified [facility] patient has questions about a rental breast pump.

## 2019-12-06 NOTE — Progress Notes (Signed)
Patient has sitz bath, patient verbalized understanding use. Last RN educated her.

## 2019-12-07 LAB — SURGICAL PATHOLOGY

## 2019-12-27 DIAGNOSIS — Z23 Encounter for immunization: Secondary | ICD-10-CM | POA: Diagnosis not present

## 2020-01-03 DIAGNOSIS — Z130 Encounter for screening for diseases of the blood and blood-forming organs and certain disorders involving the immune mechanism: Secondary | ICD-10-CM | POA: Diagnosis not present

## 2020-01-08 ENCOUNTER — Other Ambulatory Visit (HOSPITAL_COMMUNITY): Payer: Self-pay | Admitting: Endocrinology

## 2020-01-08 MED FILL — LIOTHYRONINE SODIUM 5 MCG T: 5 | 90 days supply | Qty: 360 | Fill #0

## 2020-01-09 DIAGNOSIS — F4323 Adjustment disorder with mixed anxiety and depressed mood: Secondary | ICD-10-CM | POA: Diagnosis not present

## 2020-01-23 DIAGNOSIS — F4323 Adjustment disorder with mixed anxiety and depressed mood: Secondary | ICD-10-CM | POA: Diagnosis not present

## 2020-01-23 DIAGNOSIS — Z23 Encounter for immunization: Secondary | ICD-10-CM | POA: Diagnosis not present

## 2020-02-12 ENCOUNTER — Other Ambulatory Visit (HOSPITAL_COMMUNITY): Payer: Self-pay | Admitting: Endocrinology

## 2020-02-12 MED FILL — LEVOTHYROXINE 88 MCG TABLET: 88 | 84 days supply | Qty: 108 | Fill #0

## 2020-02-13 DIAGNOSIS — F4323 Adjustment disorder with mixed anxiety and depressed mood: Secondary | ICD-10-CM | POA: Diagnosis not present

## 2020-04-11 MED FILL — LIOTHYRONINE SODIUM 5 MCG T: 5 | 90 days supply | Qty: 348 | Fill #1

## 2020-04-30 DIAGNOSIS — F4323 Adjustment disorder with mixed anxiety and depressed mood: Secondary | ICD-10-CM | POA: Diagnosis not present

## 2020-05-07 MED FILL — LEVOTHYROXINE 88 MCG TABLET: 88 | 84 days supply | Qty: 108 | Fill #1

## 2020-05-12 DIAGNOSIS — D649 Anemia, unspecified: Secondary | ICD-10-CM | POA: Diagnosis not present

## 2020-05-12 DIAGNOSIS — L940 Localized scleroderma [morphea]: Secondary | ICD-10-CM | POA: Diagnosis not present

## 2020-05-12 DIAGNOSIS — E039 Hypothyroidism, unspecified: Secondary | ICD-10-CM | POA: Diagnosis not present

## 2020-05-12 DIAGNOSIS — E890 Postprocedural hypothyroidism: Secondary | ICD-10-CM | POA: Diagnosis not present

## 2020-05-12 DIAGNOSIS — K219 Gastro-esophageal reflux disease without esophagitis: Secondary | ICD-10-CM | POA: Diagnosis not present

## 2020-05-12 DIAGNOSIS — Z23 Encounter for immunization: Secondary | ICD-10-CM | POA: Diagnosis not present

## 2020-05-14 DIAGNOSIS — F4323 Adjustment disorder with mixed anxiety and depressed mood: Secondary | ICD-10-CM | POA: Diagnosis not present

## 2020-06-12 DIAGNOSIS — F4323 Adjustment disorder with mixed anxiety and depressed mood: Secondary | ICD-10-CM | POA: Diagnosis not present

## 2020-06-26 ENCOUNTER — Ambulatory Visit: Payer: 59 | Attending: Internal Medicine

## 2020-06-26 DIAGNOSIS — Z23 Encounter for immunization: Secondary | ICD-10-CM

## 2020-06-26 NOTE — Progress Notes (Signed)
   Covid-19 Vaccination Clinic  Name:  Melinda Zimmerman    MRN: [id number] DOB: Feb 01, 1986  06/26/2020  Melinda Zimmerman was observed post Covid-19 immunization for 15 minutes without incident. She was provided with Vaccine Information Sheet and instruction to access the V-Safe system.   Melinda Zimmerman was instructed to call 911 with any severe reactions post vaccine: Marland Kitchen Difficulty breathing  . Swelling of face and throat  . A fast heartbeat  . A bad rash all over body  . Dizziness and weakness   Immunizations Administered    Name Date Dose VIS Date Route   Moderna Covid-19 Booster Vaccine 06/26/2020  4:31 PM 0.25 mL 03/26/2020 Intramuscular   Manufacturer: Moderna   Lot: [id number]   [place]: 46503-546-56

## 2020-07-11 MED FILL — LIOTHYRONINE SODIUM 5 MCG T: 5 | 90 days supply | Qty: 360 | Fill #0

## 2020-07-18 DIAGNOSIS — F4323 Adjustment disorder with mixed anxiety and depressed mood: Secondary | ICD-10-CM | POA: Diagnosis not present

## 2020-07-30 MED FILL — LEVOTHYROXINE 88 MCG TABLET: 88 | 84 days supply | Qty: 108 | Fill #2

## 2020-08-01 DIAGNOSIS — F4323 Adjustment disorder with mixed anxiety and depressed mood: Secondary | ICD-10-CM | POA: Diagnosis not present

## 2020-09-12 DIAGNOSIS — F4323 Adjustment disorder with mixed anxiety and depressed mood: Secondary | ICD-10-CM | POA: Diagnosis not present

## 2020-10-10 ENCOUNTER — Other Ambulatory Visit (HOSPITAL_COMMUNITY): Payer: Self-pay

## 2020-10-10 MED FILL — Liothyronine Sodium Tab 5 MCG: ORAL | 90 days supply | Qty: 360 | Fill #0 | Status: AC

## 2020-10-17 DIAGNOSIS — F4323 Adjustment disorder with mixed anxiety and depressed mood: Secondary | ICD-10-CM | POA: Diagnosis not present

## 2020-10-21 ENCOUNTER — Other Ambulatory Visit (HOSPITAL_COMMUNITY): Payer: Self-pay

## 2020-10-21 MED FILL — Levothyroxine Sodium Tab 88 MCG: ORAL | 84 days supply | Qty: 108 | Fill #0 | Status: AC

## 2020-12-11 DIAGNOSIS — K219 Gastro-esophageal reflux disease without esophagitis: Secondary | ICD-10-CM | POA: Diagnosis not present

## 2020-12-11 DIAGNOSIS — R7303 Prediabetes: Secondary | ICD-10-CM | POA: Diagnosis not present

## 2020-12-11 DIAGNOSIS — Z0000 Encounter for general adult medical examination without abnormal findings: Secondary | ICD-10-CM | POA: Diagnosis not present

## 2020-12-11 DIAGNOSIS — E890 Postprocedural hypothyroidism: Secondary | ICD-10-CM | POA: Diagnosis not present

## 2020-12-11 DIAGNOSIS — L940 Localized scleroderma [morphea]: Secondary | ICD-10-CM | POA: Diagnosis not present

## 2021-01-12 ENCOUNTER — Other Ambulatory Visit (HOSPITAL_COMMUNITY): Payer: Self-pay

## 2021-01-12 MED ORDER — LIOTHYRONINE SODIUM 5 MCG PO TABS
ORAL_TABLET | ORAL | 1 refills | Status: DC
  Filled 2021-01-12: qty 8, 2d supply, fill #0
  Filled 2021-01-12: qty 352, 88d supply, fill #0
  Filled 2021-04-16: qty 360, 90d supply, fill #1

## 2021-01-12 MED ORDER — LEVOTHYROXINE SODIUM 88 MCG PO TABS
ORAL_TABLET | ORAL | 3 refills | Status: DC
  Filled 2021-01-12: qty 108, 84d supply, fill #0
  Filled 2021-04-06: qty 108, 84d supply, fill #1
  Filled 2021-06-29: qty 108, 84d supply, fill #2
  Filled 2021-09-21: qty 108, 84d supply, fill #3

## 2021-01-13 ENCOUNTER — Other Ambulatory Visit (HOSPITAL_COMMUNITY): Payer: Self-pay

## 2021-03-09 DIAGNOSIS — Z01419 Encounter for gynecological examination (general) (routine) without abnormal findings: Secondary | ICD-10-CM | POA: Diagnosis not present

## 2021-03-09 DIAGNOSIS — Z124 Encounter for screening for malignant neoplasm of cervix: Secondary | ICD-10-CM | POA: Diagnosis not present

## 2021-03-13 ENCOUNTER — Ambulatory Visit: Payer: 59 | Attending: Internal Medicine

## 2021-03-13 ENCOUNTER — Other Ambulatory Visit (HOSPITAL_BASED_OUTPATIENT_CLINIC_OR_DEPARTMENT_OTHER): Payer: Self-pay

## 2021-03-13 DIAGNOSIS — Z23 Encounter for immunization: Secondary | ICD-10-CM

## 2021-03-13 MED ORDER — MODERNA COVID-19 BIVAL BOOSTER 50 MCG/0.5ML IM SUSP
INTRAMUSCULAR | 0 refills | Status: DC
Start: 2021-03-13 — End: 2022-12-31
  Filled 2021-03-13: qty 0.5, 1d supply, fill #0

## 2021-03-13 NOTE — Progress Notes (Signed)
   Covid-19 Vaccination Clinic  Name:  AMARYAH MALLEN    MRN: [id number] DOB: Jun 15, 1985  03/13/2021  Ms. Laubscher was observed post Covid-19 immunization for 15 minutes without incident. She was provided with Vaccine Information Sheet and instruction to access the V-Safe system.   Ms. Ericsson was instructed to call 911 with any severe reactions post vaccine: Difficulty breathing  Swelling of face and throat  A fast heartbeat  A bad rash all over body  Dizziness and weakness

## 2021-03-16 ENCOUNTER — Other Ambulatory Visit (HOSPITAL_COMMUNITY): Payer: Self-pay

## 2021-04-06 ENCOUNTER — Other Ambulatory Visit (HOSPITAL_COMMUNITY): Payer: Self-pay

## 2021-04-16 ENCOUNTER — Other Ambulatory Visit (HOSPITAL_COMMUNITY): Payer: Self-pay

## 2021-04-17 ENCOUNTER — Other Ambulatory Visit (HOSPITAL_COMMUNITY): Payer: Self-pay

## 2021-06-23 DIAGNOSIS — R7303 Prediabetes: Secondary | ICD-10-CM | POA: Diagnosis not present

## 2021-06-23 DIAGNOSIS — E890 Postprocedural hypothyroidism: Secondary | ICD-10-CM | POA: Diagnosis not present

## 2021-06-23 DIAGNOSIS — K219 Gastro-esophageal reflux disease without esophagitis: Secondary | ICD-10-CM | POA: Diagnosis not present

## 2021-06-23 DIAGNOSIS — L940 Localized scleroderma [morphea]: Secondary | ICD-10-CM | POA: Diagnosis not present

## 2021-06-29 ENCOUNTER — Other Ambulatory Visit (HOSPITAL_COMMUNITY): Payer: Self-pay

## 2021-07-22 ENCOUNTER — Other Ambulatory Visit (HOSPITAL_COMMUNITY): Payer: Self-pay

## 2021-07-22 MED ORDER — LIOTHYRONINE SODIUM 5 MCG PO TABS
ORAL_TABLET | ORAL | 1 refills | Status: DC
  Filled 2021-07-22: qty 360, 90d supply, fill #0
  Filled 2021-10-27: qty 360, 90d supply, fill #1

## 2021-07-23 ENCOUNTER — Other Ambulatory Visit (HOSPITAL_COMMUNITY): Payer: Self-pay

## 2021-09-21 ENCOUNTER — Other Ambulatory Visit (HOSPITAL_COMMUNITY): Payer: Self-pay

## 2021-10-27 ENCOUNTER — Other Ambulatory Visit (HOSPITAL_COMMUNITY): Payer: Self-pay

## 2021-10-28 ENCOUNTER — Other Ambulatory Visit (HOSPITAL_COMMUNITY): Payer: Self-pay

## 2021-12-11 ENCOUNTER — Other Ambulatory Visit (HOSPITAL_COMMUNITY): Payer: Self-pay

## 2021-12-11 MED ORDER — LEVOTHYROXINE SODIUM 88 MCG PO TABS
ORAL_TABLET | ORAL | 12 refills | Status: DC
Start: 2021-12-11 — End: 2022-12-27
  Filled 2021-12-11: qty 108, 84d supply, fill #0
  Filled 2022-03-22: qty 108, 84d supply, fill #1
  Filled 2022-06-22: qty 108, 84d supply, fill #2
  Filled 2022-09-20: qty 108, 84d supply, fill #3

## 2021-12-12 ENCOUNTER — Other Ambulatory Visit (HOSPITAL_COMMUNITY): Payer: Self-pay

## 2021-12-22 DIAGNOSIS — L940 Localized scleroderma [morphea]: Secondary | ICD-10-CM | POA: Diagnosis not present

## 2021-12-22 DIAGNOSIS — E890 Postprocedural hypothyroidism: Secondary | ICD-10-CM | POA: Diagnosis not present

## 2021-12-22 DIAGNOSIS — R7303 Prediabetes: Secondary | ICD-10-CM | POA: Diagnosis not present

## 2021-12-22 DIAGNOSIS — K219 Gastro-esophageal reflux disease without esophagitis: Secondary | ICD-10-CM | POA: Diagnosis not present

## 2022-01-04 DIAGNOSIS — Z319 Encounter for procreative management, unspecified: Secondary | ICD-10-CM | POA: Diagnosis not present

## 2022-01-08 ENCOUNTER — Other Ambulatory Visit (HOSPITAL_COMMUNITY): Payer: Self-pay

## 2022-01-08 MED ORDER — ESTRADIOL 0.1 MG/24HR TD PTTW
MEDICATED_PATCH | TRANSDERMAL | 3 refills | Status: DC
  Filled 2022-01-08: qty 8, 28d supply, fill #0
  Filled 2022-04-20: qty 8, 28d supply, fill #1

## 2022-01-08 MED ORDER — "EASY TOUCH HYPODERMIC NEEDLE 22G X 1-1/2"" MISC"
3 refills | Status: AC
  Filled 2022-01-08: qty 30, 30d supply, fill #0

## 2022-01-08 MED ORDER — METHYLPREDNISOLONE 8 MG PO TABS
8.0000 mg | ORAL_TABLET | Freq: Two times a day (BID) | ORAL | 1 refills | Status: DC
  Filled 2022-01-08: qty 8, 4d supply, fill #0

## 2022-01-08 MED ORDER — ESTRADIOL 2 MG PO TABS
2.0000 mg | ORAL_TABLET | Freq: Two times a day (BID) | ORAL | 3 refills | Status: DC
  Filled 2022-01-08: qty 60, 30d supply, fill #0
  Filled 2022-05-11: qty 60, 30d supply, fill #1

## 2022-01-08 MED ORDER — PROGESTERONE 50 MG/ML IM OIL
TOPICAL_OIL | INTRAMUSCULAR | 3 refills | Status: AC
  Filled 2022-01-08: qty 30, 30d supply, fill #0

## 2022-01-08 MED ORDER — "BD LUER-LOK SYRINGE 18G X 1-1/2"" 3 ML MISC"
3 refills | Status: AC
  Filled 2022-01-08: qty 30, 30d supply, fill #0

## 2022-01-11 ENCOUNTER — Other Ambulatory Visit (HOSPITAL_COMMUNITY): Payer: Self-pay

## 2022-01-15 ENCOUNTER — Other Ambulatory Visit (HOSPITAL_COMMUNITY): Payer: Self-pay

## 2022-01-28 ENCOUNTER — Other Ambulatory Visit (HOSPITAL_COMMUNITY): Payer: Self-pay

## 2022-01-28 MED ORDER — LIOTHYRONINE SODIUM 5 MCG PO TABS
ORAL_TABLET | ORAL | 12 refills | Status: DC
  Filled 2022-01-28: qty 19, 5d supply, fill #0
  Filled 2022-01-29: qty 341, 85d supply, fill #0
  Filled 2022-04-26: qty 348, 87d supply, fill #1
  Filled 2022-04-26: qty 12, 3d supply, fill #1
  Filled 2022-07-26 (×3): qty 360, 90d supply, fill #2
  Filled 2022-10-22: qty 360, 90d supply, fill #3

## 2022-01-29 ENCOUNTER — Other Ambulatory Visit (HOSPITAL_COMMUNITY): Payer: Self-pay

## 2022-03-22 ENCOUNTER — Other Ambulatory Visit (HOSPITAL_COMMUNITY): Payer: Self-pay

## 2022-03-24 ENCOUNTER — Other Ambulatory Visit (HOSPITAL_COMMUNITY): Payer: Self-pay

## 2022-04-19 DIAGNOSIS — N8302 Follicular cyst of left ovary: Secondary | ICD-10-CM | POA: Diagnosis not present

## 2022-04-19 DIAGNOSIS — D250 Submucous leiomyoma of uterus: Secondary | ICD-10-CM | POA: Diagnosis not present

## 2022-04-19 DIAGNOSIS — Z319 Encounter for procreative management, unspecified: Secondary | ICD-10-CM | POA: Diagnosis not present

## 2022-04-20 ENCOUNTER — Other Ambulatory Visit (HOSPITAL_COMMUNITY): Payer: Self-pay

## 2022-04-23 ENCOUNTER — Other Ambulatory Visit (HOSPITAL_COMMUNITY): Payer: Self-pay

## 2022-04-23 MED ORDER — ONDANSETRON HCL 4 MG PO TABS
4.0000 mg | ORAL_TABLET | Freq: Three times a day (TID) | ORAL | 0 refills | Status: AC
  Filled 2022-04-23: qty 90, 30d supply, fill #0

## 2022-04-26 ENCOUNTER — Other Ambulatory Visit (HOSPITAL_COMMUNITY): Payer: Self-pay

## 2022-04-27 ENCOUNTER — Other Ambulatory Visit (HOSPITAL_COMMUNITY): Payer: Self-pay

## 2022-04-27 MED ORDER — ESTRADIOL 0.1 MG/24HR TD PTTW
2.0000 | MEDICATED_PATCH | TRANSDERMAL | 3 refills | Status: DC
  Filled 2022-04-27 – 2022-05-03 (×3): qty 16, 24d supply, fill #0
  Filled 2022-05-18: qty 16, 24d supply, fill #1

## 2022-04-28 ENCOUNTER — Other Ambulatory Visit (HOSPITAL_COMMUNITY): Payer: Self-pay

## 2022-05-03 ENCOUNTER — Other Ambulatory Visit: Payer: Self-pay

## 2022-05-03 ENCOUNTER — Other Ambulatory Visit (HOSPITAL_COMMUNITY): Payer: Self-pay

## 2022-05-11 ENCOUNTER — Other Ambulatory Visit (HOSPITAL_COMMUNITY): Payer: Self-pay

## 2022-05-14 DIAGNOSIS — D250 Submucous leiomyoma of uterus: Secondary | ICD-10-CM | POA: Diagnosis not present

## 2022-05-14 DIAGNOSIS — N711 Chronic inflammatory disease of uterus: Secondary | ICD-10-CM | POA: Diagnosis not present

## 2022-05-14 DIAGNOSIS — N8003 Adenomyosis of the uterus: Secondary | ICD-10-CM | POA: Diagnosis not present

## 2022-05-18 ENCOUNTER — Other Ambulatory Visit (HOSPITAL_COMMUNITY): Payer: Self-pay

## 2022-05-21 ENCOUNTER — Other Ambulatory Visit (HOSPITAL_COMMUNITY): Payer: Self-pay

## 2022-05-21 MED ORDER — AMOXICILLIN-POT CLAVULANATE 500-125 MG PO TABS
1.0000 | ORAL_TABLET | Freq: Three times a day (TID) | ORAL | 0 refills | Status: DC
Start: 2022-05-21 — End: 2022-12-31
  Filled 2022-05-21: qty 21, 7d supply, fill #0

## 2022-07-26 ENCOUNTER — Other Ambulatory Visit: Payer: Self-pay

## 2022-07-26 ENCOUNTER — Other Ambulatory Visit (HOSPITAL_COMMUNITY): Payer: Self-pay

## 2022-07-27 ENCOUNTER — Other Ambulatory Visit (HOSPITAL_COMMUNITY): Payer: Self-pay

## 2022-08-10 DIAGNOSIS — E890 Postprocedural hypothyroidism: Secondary | ICD-10-CM | POA: Diagnosis not present

## 2022-08-10 DIAGNOSIS — K219 Gastro-esophageal reflux disease without esophagitis: Secondary | ICD-10-CM | POA: Diagnosis not present

## 2022-08-10 DIAGNOSIS — R7303 Prediabetes: Secondary | ICD-10-CM | POA: Diagnosis not present

## 2022-08-10 DIAGNOSIS — L940 Localized scleroderma [morphea]: Secondary | ICD-10-CM | POA: Diagnosis not present

## 2022-09-15 ENCOUNTER — Other Ambulatory Visit (HOSPITAL_COMMUNITY): Payer: Self-pay

## 2022-09-15 MED ORDER — DOXYCYCLINE HYCLATE 100 MG PO CAPS
100.0000 mg | ORAL_CAPSULE | Freq: Two times a day (BID) | ORAL | 2 refills | Status: DC
  Filled 2022-09-15: qty 10, 5d supply, fill #0

## 2022-09-16 DIAGNOSIS — Z3141 Encounter for fertility testing: Secondary | ICD-10-CM | POA: Diagnosis not present

## 2022-09-16 DIAGNOSIS — N711 Chronic inflammatory disease of uterus: Secondary | ICD-10-CM | POA: Diagnosis not present

## 2022-09-20 ENCOUNTER — Other Ambulatory Visit (HOSPITAL_COMMUNITY): Payer: Self-pay

## 2022-09-21 ENCOUNTER — Other Ambulatory Visit (HOSPITAL_COMMUNITY): Payer: Self-pay

## 2022-10-15 DIAGNOSIS — Z3183 Encounter for assisted reproductive fertility procedure cycle: Secondary | ICD-10-CM | POA: Diagnosis not present

## 2022-10-18 ENCOUNTER — Other Ambulatory Visit (HOSPITAL_COMMUNITY): Payer: Self-pay

## 2022-10-18 MED ORDER — PROGESTERONE 50 MG/ML IM OIL
50.0000 mg | TOPICAL_OIL | Freq: Every day | INTRAMUSCULAR | 3 refills | Status: AC
  Filled 2022-10-18: qty 30, 30d supply, fill #0
  Filled 2022-11-14: qty 30, 30d supply, fill #1

## 2022-10-18 MED ORDER — "BD LUER-LOK SYRINGE 18G X 1-1/2"" 3 ML MISC"
3 refills | Status: AC
  Filled 2022-10-18: qty 30, 30d supply, fill #0
  Filled 2022-12-06: qty 30, 30d supply, fill #1

## 2022-10-18 MED ORDER — ESTRADIOL 0.1 MG/24HR TD PTTW
1.0000 | MEDICATED_PATCH | TRANSDERMAL | 3 refills | Status: DC
  Filled 2022-10-18: qty 8, 28d supply, fill #0
  Filled 2022-11-14: qty 8, 28d supply, fill #1

## 2022-10-18 MED ORDER — ESTRADIOL 2 MG PO TABS
2.0000 mg | ORAL_TABLET | Freq: Two times a day (BID) | ORAL | 3 refills | Status: DC
Start: 2022-10-18 — End: 2022-12-31
  Filled 2022-10-18: qty 60, 30d supply, fill #0
  Filled 2022-11-14: qty 60, 30d supply, fill #1

## 2022-10-18 MED ORDER — "NEEDLE (DISP) 22G X 1-1/2"" MISC"
3 refills | Status: AC
  Filled 2022-10-18: qty 30, 30d supply, fill #0
  Filled 2022-12-06: qty 30, 30d supply, fill #1

## 2022-10-18 MED ORDER — METHYLPREDNISOLONE 4 MG PO TABS
8.0000 mg | ORAL_TABLET | Freq: Two times a day (BID) | ORAL | 1 refills | Status: DC
  Filled 2022-10-18: qty 16, 4d supply, fill #0

## 2022-10-19 ENCOUNTER — Other Ambulatory Visit (HOSPITAL_COMMUNITY): Payer: Self-pay

## 2022-10-22 ENCOUNTER — Other Ambulatory Visit (HOSPITAL_COMMUNITY): Payer: Self-pay

## 2022-11-03 DIAGNOSIS — Z3202 Encounter for pregnancy test, result negative: Secondary | ICD-10-CM | POA: Diagnosis not present

## 2022-11-05 DIAGNOSIS — Z3183 Encounter for assisted reproductive fertility procedure cycle: Secondary | ICD-10-CM | POA: Diagnosis not present

## 2022-11-05 DIAGNOSIS — N979 Female infertility, unspecified: Secondary | ICD-10-CM | POA: Diagnosis not present

## 2022-11-10 DIAGNOSIS — Z3200 Encounter for pregnancy test, result unknown: Secondary | ICD-10-CM | POA: Diagnosis not present

## 2022-11-15 ENCOUNTER — Other Ambulatory Visit: Payer: Self-pay

## 2022-11-17 DIAGNOSIS — Z3200 Encounter for pregnancy test, result unknown: Secondary | ICD-10-CM | POA: Diagnosis not present

## 2022-11-24 DIAGNOSIS — O0900 Supervision of pregnancy with history of infertility, unspecified trimester: Secondary | ICD-10-CM | POA: Diagnosis not present

## 2022-11-24 DIAGNOSIS — Z3201 Encounter for pregnancy test, result positive: Secondary | ICD-10-CM | POA: Diagnosis not present

## 2022-11-25 ENCOUNTER — Other Ambulatory Visit (HOSPITAL_COMMUNITY): Payer: Self-pay

## 2022-11-25 MED ORDER — PROMETHAZINE HCL 12.5 MG PO TABS
12.5000 mg | ORAL_TABLET | Freq: Three times a day (TID) | ORAL | 0 refills | Status: AC | PRN
  Filled 2022-11-25: qty 20, 7d supply, fill #0

## 2022-11-25 MED ORDER — ONDANSETRON HCL 4 MG PO TABS
4.0000 mg | ORAL_TABLET | Freq: Three times a day (TID) | ORAL | 0 refills | Status: AC
  Filled 2022-11-25: qty 20, 7d supply, fill #0

## 2022-12-07 ENCOUNTER — Other Ambulatory Visit: Payer: Self-pay

## 2022-12-07 ENCOUNTER — Other Ambulatory Visit (HOSPITAL_COMMUNITY): Payer: Self-pay

## 2022-12-07 DIAGNOSIS — O0900 Supervision of pregnancy with history of infertility, unspecified trimester: Secondary | ICD-10-CM | POA: Diagnosis not present

## 2022-12-07 MED ORDER — PROGESTERONE 200 MG PO CAPS
200.0000 mg | ORAL_CAPSULE | Freq: Every day | ORAL | 0 refills | Status: AC
  Filled 2022-12-07: qty 14, 14d supply, fill #0

## 2022-12-08 ENCOUNTER — Other Ambulatory Visit: Payer: Self-pay

## 2022-12-15 DIAGNOSIS — O0990 Supervision of high risk pregnancy, unspecified, unspecified trimester: Secondary | ICD-10-CM | POA: Diagnosis not present

## 2022-12-15 DIAGNOSIS — E039 Hypothyroidism, unspecified: Secondary | ICD-10-CM | POA: Diagnosis not present

## 2022-12-15 DIAGNOSIS — O09819 Supervision of pregnancy resulting from assisted reproductive technology, unspecified trimester: Secondary | ICD-10-CM | POA: Diagnosis not present

## 2022-12-15 DIAGNOSIS — L940 Localized scleroderma [morphea]: Secondary | ICD-10-CM | POA: Diagnosis not present

## 2022-12-15 DIAGNOSIS — O09529 Supervision of elderly multigravida, unspecified trimester: Secondary | ICD-10-CM | POA: Diagnosis not present

## 2022-12-22 DIAGNOSIS — E890 Postprocedural hypothyroidism: Secondary | ICD-10-CM | POA: Diagnosis not present

## 2022-12-27 ENCOUNTER — Other Ambulatory Visit (HOSPITAL_COMMUNITY): Payer: Self-pay

## 2022-12-27 DIAGNOSIS — O09819 Supervision of pregnancy resulting from assisted reproductive technology, unspecified trimester: Secondary | ICD-10-CM | POA: Diagnosis not present

## 2022-12-27 DIAGNOSIS — O0990 Supervision of high risk pregnancy, unspecified, unspecified trimester: Secondary | ICD-10-CM | POA: Diagnosis not present

## 2022-12-27 DIAGNOSIS — Z3480 Encounter for supervision of other normal pregnancy, unspecified trimester: Secondary | ICD-10-CM | POA: Diagnosis not present

## 2022-12-27 DIAGNOSIS — Z3481 Encounter for supervision of other normal pregnancy, first trimester: Secondary | ICD-10-CM | POA: Diagnosis not present

## 2022-12-27 DIAGNOSIS — Z113 Encounter for screening for infections with a predominantly sexual mode of transmission: Secondary | ICD-10-CM | POA: Diagnosis not present

## 2022-12-27 MED ORDER — DOXYLAMINE-PYRIDOXINE 10-10 MG PO TBEC
DELAYED_RELEASE_TABLET | ORAL | 1 refills | Status: AC
  Filled 2022-12-27: qty 60, 30d supply, fill #0

## 2022-12-27 MED ORDER — LEVOTHYROXINE SODIUM 88 MCG PO TABS
ORAL_TABLET | ORAL | 12 refills | Status: AC
  Filled 2022-12-27: qty 108, 84d supply, fill #0

## 2022-12-31 ENCOUNTER — Inpatient Hospital Stay (HOSPITAL_COMMUNITY): Payer: 59

## 2022-12-31 ENCOUNTER — Encounter (HOSPITAL_COMMUNITY): Payer: Self-pay | Admitting: *Deleted

## 2022-12-31 ENCOUNTER — Inpatient Hospital Stay (HOSPITAL_COMMUNITY)
Admission: AD | Admit: 2022-12-31 | Discharge: 2022-12-31 | Disposition: A | Discharge status: Home / Self Care | Payer: 59 | Attending: Obstetrics and Gynecology | Admitting: Obstetrics and Gynecology

## 2022-12-31 DIAGNOSIS — O26899 Other specified pregnancy related conditions, unspecified trimester: Secondary | ICD-10-CM

## 2022-12-31 DIAGNOSIS — Z3A12 12 weeks gestation of pregnancy: Secondary | ICD-10-CM | POA: Insufficient documentation

## 2022-12-31 DIAGNOSIS — O26891 Other specified pregnancy related conditions, first trimester: Secondary | ICD-10-CM | POA: Diagnosis not present

## 2022-12-31 DIAGNOSIS — R109 Unspecified abdominal pain: Secondary | ICD-10-CM

## 2022-12-31 DIAGNOSIS — R1031 Right lower quadrant pain: Secondary | ICD-10-CM | POA: Diagnosis not present

## 2022-12-31 DIAGNOSIS — O09811 Supervision of pregnancy resulting from assisted reproductive technology, first trimester: Secondary | ICD-10-CM | POA: Diagnosis not present

## 2022-12-31 DIAGNOSIS — E890 Postprocedural hypothyroidism: Secondary | ICD-10-CM | POA: Diagnosis not present

## 2022-12-31 DIAGNOSIS — R1013 Epigastric pain: Secondary | ICD-10-CM | POA: Insufficient documentation

## 2022-12-31 DIAGNOSIS — O99281 Endocrine, nutritional and metabolic diseases complicating pregnancy, first trimester: Secondary | ICD-10-CM | POA: Diagnosis not present

## 2022-12-31 LAB — CBC WITH DIFFERENTIAL/PLATELET
Abs Immature Granulocytes: 0.02 10*3/uL (ref 0.00–0.07)
Basophils Absolute: 0 10*3/uL (ref 0.0–0.1)
Basophils Relative: 0 %
Eosinophils Absolute: 0.1 10*3/uL (ref 0.0–0.5)
Eosinophils Relative: 1 %
HCT: 36.4 % (ref 36.0–46.0)
Hemoglobin: 12.8 g/dL (ref 12.0–15.0)
Immature Granulocytes: 0 %
Lymphocytes Relative: 24 %
Lymphs Abs: 1.7 10*3/uL (ref 0.7–4.0)
MCH: 31.2 pg (ref 26.0–34.0)
MCHC: 35.2 g/dL (ref 30.0–36.0)
MCV: 88.8 fL (ref 80.0–100.0)
Monocytes Absolute: 0.4 10*3/uL (ref 0.1–1.0)
Monocytes Relative: 5 %
Neutro Abs: 5 10*3/uL (ref 1.7–7.7)
Neutrophils Relative %: 70 %
Platelets: 201 10*3/uL (ref 150–400)
RBC: 4.1 MIL/uL (ref 3.87–5.11)
RDW: 12.4 % (ref 11.5–15.5)
WBC: 7.1 10*3/uL (ref 4.0–10.5)
nRBC: 0 % (ref 0.0–0.2)

## 2022-12-31 LAB — COMPREHENSIVE METABOLIC PANEL
ALT: 25 U/L (ref 0–44)
AST: 19 U/L (ref 15–41)
Albumin: 3.7 g/dL (ref 3.5–5.0)
Alkaline Phosphatase: 38 U/L (ref 38–126)
Anion gap: 11 (ref 5–15)
BUN: 9 mg/dL (ref 6–20)
CO2: 23 mmol/L (ref 22–32)
Calcium: 9 mg/dL (ref 8.9–10.3)
Chloride: 102 mmol/L (ref 98–111)
Creatinine, Ser: 0.47 mg/dL (ref 0.44–1.00)
GFR, Estimated: >60 mL/min (ref >60)
Glucose, Bld: 91 mg/dL (ref 70–99)
Potassium: 3.6 mmol/L (ref 3.5–5.1)
Sodium: 136 mmol/L (ref 135–145)
Total Bilirubin: 0.6 mg/dL (ref 0.3–1.2)
Total Protein: 6.3 g/dL — ABNORMAL LOW (ref 6.5–8.1)

## 2022-12-31 LAB — URINALYSIS, ROUTINE W REFLEX MICROSCOPIC
Bilirubin Urine: NEGATIVE
Glucose, UA: NEGATIVE mg/dL
Hgb urine dipstick: NEGATIVE
Ketones, ur: NEGATIVE mg/dL
Nitrite: NEGATIVE
Protein, ur: NEGATIVE mg/dL
Specific Gravity, Urine: 1.01 (ref 1.005–1.030)
pH: 5 (ref 5.0–8.0)

## 2022-12-31 LAB — LIPASE, BLOOD: Lipase: 32 U/L (ref 11–51)

## 2022-12-31 LAB — TROPONIN I (HIGH SENSITIVITY): Troponin I (High Sensitivity): 3 ng/L (ref <18)

## 2022-12-31 NOTE — MAU Note (Signed)
.  Melinda Zimmerman is a 37 y.o. at Unknown here in MAU reporting: IVF pregnancy with [facility] 07/14/23.( IUP in office U/S) Woke up this morning with a sharp pain too her right flank /and it shoots up to her right shoulder/chest area. Took Tums and pepcid and zofran and tylenol without relief. Denies any vag bleeding or discharge.  LMP:  Onset of complaint: 7am Pain score: 9 Vitals:   12/31/22 1158  BP: 110/77  Pulse: 79  Resp: 18  Temp: 98.4 F (36.9 C)  SpO2: 98%     FHT:did not attempt in triage due to pt discomfort and position Lab orders placed from triage:  u/a

## 2022-12-31 NOTE — MAU Provider Note (Signed)
History     CSN: [id number]  Arrival date and time: 12/31/22 1133   Event Date/Time   First Provider Initiated Contact with Patient 12/31/22 1240      Chief Complaint  Patient presents with   Flank Pain   HPI Melinda Zimmerman is a 37 y.o. G3P1011 at [redacted]w[redacted]d who presents to MAU for right sided pain. She reports pain started in the right groin and radiated into right side and up into the right side of her chest and right shoulder. Pain started this morning. She was able to go to work, however it progressively worsened and she had to leave. She reports sitting up worsens her symptoms and lying on her left side relieves the pain. She initially thought the pain was related to gas so she took Tums, Pepcid, and Tylenol which did not relieve her symptoms. She also reports she had a bowel movement thinking that would help relieve symptoms but it did not. She reports some nausea which has been ongoing. She was able to eat breakfast, last ate at 0830. She reports when the pain comes, it feels hard to breathe. She denies fever, chills, palpitations. No cardiac or pulmonary history. She is having normal BM's. She denies vaginal bleeding, discharge or urinary s/s.   Pregnancy course: IVF pregnancy. Receives [facility] at [facility].   PMHx: right salpingectomy, appendectomy.   OB History     Gravida  3   Para  1   Term  1   Preterm      AB  1   Living  1      SAB      IAB      Ectopic  1   Multiple  0   Live Births  1           Past Medical History:  Diagnosis Date   Graves disease    Hypothyroidism    Scleroderma (HCC) 1995    Past Surgical History:  Procedure Laterality Date   egg retreival     FACIAL COSMETIC SURGERY     eye lift both eyes   LAPAROSCOPY     lysis of adhesions, left salpingectomy myomectomy, right fimbrioplasty, chromotubation   SALPINGECTOMY Left 10/2018   THYROIDECTOMY  2010    Family History  Problem Relation Age of Onset   Diabetes  Father     Social History   Tobacco Use   Smoking status: Never   Smokeless tobacco: Never  Vaping Use   Vaping status: Never Used  Substance Use Topics   Alcohol use: Never   Drug use: Never    Allergies:  Allergies  Allergen Reactions   Iodine Rash   Tape Rash    Tegaderm     No medications prior to admission.   Review of Systems  Cardiovascular:  Positive for chest pain.  Gastrointestinal:  Positive for abdominal pain and nausea.  Musculoskeletal:        Shoulder pain   All other systems reviewed and are negative.  Physical Exam   Patient Vitals for the past 24 hrs:  BP Temp Pulse Resp SpO2  12/31/22 1215 112/71 -- 72 -- --  12/31/22 1158 110/77 98.4 F (36.9 C) 79 18 98 %   Physical Exam Vitals and nursing note reviewed.  Constitutional:      General: She is not in acute distress. Eyes:     Extraocular Movements: Extraocular movements intact.     Pupils: Pupils are equal, round, and reactive  to light.  Cardiovascular:     Rate and Rhythm: Normal rate and regular rhythm.     Heart sounds: Normal heart sounds.  Pulmonary:     Effort: Pulmonary effort is normal. No respiratory distress.     Breath sounds: Normal breath sounds. No wheezing.  Abdominal:     Palpations: Abdomen is soft.     Tenderness: There is abdominal tenderness in the epigastric area. Negative signs include Murphy's sign and McBurney's sign.  Musculoskeletal:        General: Normal range of motion.     Cervical back: Normal range of motion.  Skin:    General: Skin is warm and dry.  Neurological:     General: No focal deficit present.     Mental Status: She is alert and oriented to person, place, and time.  Psychiatric:        Mood and Affect: Mood normal.        Behavior: Behavior normal.    FHR: 162 bpm via doppler  Results for orders placed or performed during the hospital encounter of 12/31/22 (from the past 24 hour(s))  Urinalysis, Routine w reflex microscopic -Urine,  Clean Catch     Status: Abnormal   Collection Time: 12/31/22 12:22 PM  Result Value Ref Range   Color, Urine YELLOW YELLOW   APPearance HAZY (A) CLEAR   Specific Gravity, Urine 1.010 1.005 - 1.030   pH 5.0 5.0 - 8.0   Glucose, UA NEGATIVE NEGATIVE mg/dL   Hgb urine dipstick NEGATIVE NEGATIVE   Bilirubin Urine NEGATIVE NEGATIVE   Ketones, ur NEGATIVE NEGATIVE mg/dL   Protein, ur NEGATIVE NEGATIVE mg/dL   Nitrite NEGATIVE NEGATIVE   Leukocytes,Ua MODERATE (A) NEGATIVE   RBC / HPF 0-5 0 - 5 RBC/hpf   WBC, UA 6-10 0 - 5 WBC/hpf   Bacteria, UA RARE (A) NONE SEEN   Squamous Epithelial / HPF 6-10 0 - 5 /HPF   Mucus PRESENT   CBC with Differential/Platelet     Status: None   Collection Time: 12/31/22  1:15 PM  Result Value Ref Range   WBC 7.1 4.0 - 10.5 K/uL   RBC 4.10 3.87 - 5.11 MIL/uL   Hemoglobin 12.8 12.0 - 15.0 g/dL   HCT 16.1 09.6 - 04.5 %   MCV 88.8 80.0 - 100.0 fL   MCH 31.2 26.0 - 34.0 pg   MCHC 35.2 30.0 - 36.0 g/dL   RDW 40.9 81.1 - 91.4 %   Platelets 201 150 - 400 K/uL   nRBC 0.0 0.0 - 0.2 %   Neutrophils Relative % 70 %   Neutro Abs 5.0 1.7 - 7.7 K/uL   Lymphocytes Relative 24 %   Lymphs Abs 1.7 0.7 - 4.0 K/uL   Monocytes Relative 5 %   Monocytes Absolute 0.4 0.1 - 1.0 K/uL   Eosinophils Relative 1 %   Eosinophils Absolute 0.1 0.0 - 0.5 K/uL   Basophils Relative 0 %   Basophils Absolute 0.0 0.0 - 0.1 K/uL   Immature Granulocytes 0 %   Abs Immature Granulocytes 0.02 0.00 - 0.07 K/uL  Comprehensive metabolic panel     Status: Abnormal   Collection Time: 12/31/22  1:15 PM  Result Value Ref Range   Sodium 136 135 - 145 mmol/L   Potassium 3.6 3.5 - 5.1 mmol/L   Chloride 102 98 - 111 mmol/L   CO2 23 22 - 32 mmol/L   Glucose, Bld 91 70 - 99 mg/dL   BUN 9  6 - 20 mg/dL   Creatinine, Ser 2.95 0.44 - 1.00 mg/dL   Calcium 9.0 8.9 - 62.1 mg/dL   Total Protein 6.3 (L) 6.5 - 8.1 g/dL   Albumin 3.7 3.5 - 5.0 g/dL   AST 19 15 - 41 U/L   ALT 25 0 - 44 U/L   Alkaline  Phosphatase 38 38 - 126 U/L   Total Bilirubin 0.6 0.3 - 1.2 mg/dL   GFR, Estimated >30 >86 mL/min   Anion gap 11 5 - 15  Lipase, blood     Status: None   Collection Time: 12/31/22  1:15 PM  Result Value Ref Range   Lipase 32 11 - 51 U/L  Troponin I (High Sensitivity)     Status: None   Collection Time: 12/31/22  1:15 PM  Result Value Ref Range   Troponin I (High Sensitivity) 3 <18 ng/L   US Abdomen Limited RUQ (LIVER/GB)  Result Date: 12/31/2022 CLINICAL DATA:  Epigastric abdominal pain EXAM: ULTRASOUND ABDOMEN LIMITED RIGHT UPPER QUADRANT COMPARISON:  None Available. FINDINGS: Gallbladder: No gallstones or wall thickening visualized. No sonographic Murphy sign noted by sonographer. Common bile duct: Diameter: 4 mm Liver: No focal lesion identified. Within normal limits in parenchymal echogenicity. Portal vein is patent on color Doppler imaging with normal direction of blood flow towards the liver. Other: None. IMPRESSION: Normal right upper quadrant ultrasound. Electronically Signed   By: Larose Hires D.O.   On: 12/31/2022 14:13    MAU Course  Procedures  MDM UA, culture CBC, CMP, Lipase RUQ [place]  UA reassuring, some leukocytes with rare bacteria. Urine culture added. Labs unremarkable without significant electrolyte abnormalities. No leukocytosis. Troponin negative. EKG shows normal sinus rhythm. RUQ ultrasound negative without evidence for gallstones. Low suspicion for ACS or respiratory process. I suspect MSK vs other GI etiology.   On reassessment, patient is sitting up comfortably in the bed. She reports pain has improved since arrival. She was offered medications to take here and at home but declines.   Assessment and Plan   1. [redacted] weeks gestation of pregnancy   2. Abdominal pain affecting pregnancy    - Discharge home in stable condition - Return precautions given. Return to MAU as needed - Keep OB appointment as scheduled   Brand Males, CNM 12/31/2022, 7:32 PM

## 2023-01-06 ENCOUNTER — Other Ambulatory Visit (HOSPITAL_COMMUNITY): Payer: Self-pay

## 2023-01-06 MED ORDER — ONDANSETRON 8 MG PO TBDP
ORAL_TABLET | ORAL | 0 refills | Status: DC
  Filled 2023-01-06: qty 20, 7d supply, fill #0

## 2023-01-07 ENCOUNTER — Other Ambulatory Visit (HOSPITAL_COMMUNITY): Payer: Self-pay

## 2023-01-07 MED ORDER — LIOTHYRONINE SODIUM 5 MCG PO TABS
ORAL_TABLET | ORAL | 12 refills | Status: DC
  Filled 2023-01-07: qty 324, 84d supply, fill #0
  Filled 2023-03-22: qty 90, 23d supply, fill #1
  Filled 2023-03-24: qty 234, 61d supply, fill #1
  Filled 2023-06-15: qty 324, 84d supply, fill #2
  Filled 2023-09-07: qty 324, 84d supply, fill #3
  Filled 2023-11-28: qty 324, 84d supply, fill #4

## 2023-01-24 ENCOUNTER — Other Ambulatory Visit (HOSPITAL_COMMUNITY): Payer: Self-pay

## 2023-01-24 DIAGNOSIS — Z3A15 15 weeks gestation of pregnancy: Secondary | ICD-10-CM | POA: Diagnosis not present

## 2023-01-24 DIAGNOSIS — Z3482 Encounter for supervision of other normal pregnancy, second trimester: Secondary | ICD-10-CM | POA: Diagnosis not present

## 2023-01-24 DIAGNOSIS — Z369 Encounter for antenatal screening, unspecified: Secondary | ICD-10-CM | POA: Diagnosis not present

## 2023-01-24 MED ORDER — ONDANSETRON 4 MG PO TBDP
ORAL_TABLET | ORAL | 1 refills | Status: AC
  Filled 2023-01-24: qty 30, 8d supply, fill #0

## 2023-01-27 DIAGNOSIS — E039 Hypothyroidism, unspecified: Secondary | ICD-10-CM | POA: Diagnosis not present

## 2023-01-27 DIAGNOSIS — L940 Localized scleroderma [morphea]: Secondary | ICD-10-CM | POA: Diagnosis not present

## 2023-01-27 DIAGNOSIS — O09529 Supervision of elderly multigravida, unspecified trimester: Secondary | ICD-10-CM | POA: Diagnosis not present

## 2023-01-27 DIAGNOSIS — O0990 Supervision of high risk pregnancy, unspecified, unspecified trimester: Secondary | ICD-10-CM | POA: Diagnosis not present

## 2023-01-27 DIAGNOSIS — O09819 Supervision of pregnancy resulting from assisted reproductive technology, unspecified trimester: Secondary | ICD-10-CM | POA: Diagnosis not present

## 2023-02-03 DIAGNOSIS — O09819 Supervision of pregnancy resulting from assisted reproductive technology, unspecified trimester: Secondary | ICD-10-CM | POA: Diagnosis not present

## 2023-02-03 DIAGNOSIS — L940 Localized scleroderma [morphea]: Secondary | ICD-10-CM | POA: Diagnosis not present

## 2023-02-03 DIAGNOSIS — O0990 Supervision of high risk pregnancy, unspecified, unspecified trimester: Secondary | ICD-10-CM | POA: Diagnosis not present

## 2023-02-03 DIAGNOSIS — O09529 Supervision of elderly multigravida, unspecified trimester: Secondary | ICD-10-CM | POA: Diagnosis not present

## 2023-02-03 DIAGNOSIS — E039 Hypothyroidism, unspecified: Secondary | ICD-10-CM | POA: Diagnosis not present

## 2023-02-15 DIAGNOSIS — Z369 Encounter for antenatal screening, unspecified: Secondary | ICD-10-CM | POA: Diagnosis not present

## 2023-02-15 DIAGNOSIS — Z3482 Encounter for supervision of other normal pregnancy, second trimester: Secondary | ICD-10-CM | POA: Diagnosis not present

## 2023-02-15 DIAGNOSIS — Z3A19 19 weeks gestation of pregnancy: Secondary | ICD-10-CM | POA: Diagnosis not present

## 2023-02-17 DIAGNOSIS — O0992 Supervision of high risk pregnancy, unspecified, second trimester: Secondary | ICD-10-CM | POA: Diagnosis not present

## 2023-02-17 DIAGNOSIS — Z3A19 19 weeks gestation of pregnancy: Secondary | ICD-10-CM | POA: Diagnosis not present

## 2023-02-17 DIAGNOSIS — Z3689 Encounter for other specified antenatal screening: Secondary | ICD-10-CM | POA: Diagnosis not present

## 2023-02-17 DIAGNOSIS — O09819 Supervision of pregnancy resulting from assisted reproductive technology, unspecified trimester: Secondary | ICD-10-CM | POA: Diagnosis not present

## 2023-02-17 DIAGNOSIS — O09812 Supervision of pregnancy resulting from assisted reproductive technology, second trimester: Secondary | ICD-10-CM | POA: Diagnosis not present

## 2023-02-17 DIAGNOSIS — O4442 Low lying placenta NOS or without hemorrhage, second trimester: Secondary | ICD-10-CM | POA: Diagnosis not present

## 2023-02-17 DIAGNOSIS — O4402 Placenta previa specified as without hemorrhage, second trimester: Secondary | ICD-10-CM | POA: Diagnosis not present

## 2023-02-17 DIAGNOSIS — O09522 Supervision of elderly multigravida, second trimester: Secondary | ICD-10-CM | POA: Diagnosis not present

## 2023-03-07 DIAGNOSIS — L940 Localized scleroderma [morphea]: Secondary | ICD-10-CM | POA: Diagnosis not present

## 2023-03-07 DIAGNOSIS — Z0000 Encounter for general adult medical examination without abnormal findings: Secondary | ICD-10-CM | POA: Diagnosis not present

## 2023-03-07 DIAGNOSIS — Z1389 Encounter for screening for other disorder: Secondary | ICD-10-CM | POA: Diagnosis not present

## 2023-03-07 DIAGNOSIS — R7303 Prediabetes: Secondary | ICD-10-CM | POA: Diagnosis not present

## 2023-03-07 DIAGNOSIS — E890 Postprocedural hypothyroidism: Secondary | ICD-10-CM | POA: Diagnosis not present

## 2023-03-07 DIAGNOSIS — Z3A20 20 weeks gestation of pregnancy: Secondary | ICD-10-CM | POA: Diagnosis not present

## 2023-03-15 ENCOUNTER — Other Ambulatory Visit (HOSPITAL_COMMUNITY): Payer: Self-pay

## 2023-03-15 DIAGNOSIS — E079 Disorder of thyroid, unspecified: Secondary | ICD-10-CM | POA: Diagnosis not present

## 2023-03-15 DIAGNOSIS — M349 Systemic sclerosis, unspecified: Secondary | ICD-10-CM | POA: Diagnosis not present

## 2023-03-15 DIAGNOSIS — O4442 Low lying placenta NOS or without hemorrhage, second trimester: Secondary | ICD-10-CM | POA: Diagnosis not present

## 2023-03-15 DIAGNOSIS — O09522 Supervision of elderly multigravida, second trimester: Secondary | ICD-10-CM | POA: Diagnosis not present

## 2023-03-15 DIAGNOSIS — O99891 Other specified diseases and conditions complicating pregnancy: Secondary | ICD-10-CM | POA: Diagnosis not present

## 2023-03-15 DIAGNOSIS — O09812 Supervision of pregnancy resulting from assisted reproductive technology, second trimester: Secondary | ICD-10-CM | POA: Diagnosis not present

## 2023-03-15 DIAGNOSIS — O99282 Endocrine, nutritional and metabolic diseases complicating pregnancy, second trimester: Secondary | ICD-10-CM | POA: Diagnosis not present

## 2023-03-15 DIAGNOSIS — Z3A22 22 weeks gestation of pregnancy: Secondary | ICD-10-CM | POA: Diagnosis not present

## 2023-03-15 DIAGNOSIS — Z23 Encounter for immunization: Secondary | ICD-10-CM | POA: Diagnosis not present

## 2023-03-15 MED ORDER — LEVOTHYROXINE SODIUM 150 MCG PO TABS
ORAL_TABLET | ORAL | 3 refills | Status: DC
  Filled 2023-03-15: qty 30, 30d supply, fill #0
  Filled 2023-04-13: qty 30, 30d supply, fill #1
  Filled 2023-05-12: qty 30, 30d supply, fill #2
  Filled 2023-06-23: qty 30, 30d supply, fill #3

## 2023-03-16 DIAGNOSIS — Z369 Encounter for antenatal screening, unspecified: Secondary | ICD-10-CM | POA: Diagnosis not present

## 2023-03-16 DIAGNOSIS — Z3A23 23 weeks gestation of pregnancy: Secondary | ICD-10-CM | POA: Diagnosis not present

## 2023-03-16 DIAGNOSIS — Z3482 Encounter for supervision of other normal pregnancy, second trimester: Secondary | ICD-10-CM | POA: Diagnosis not present

## 2023-03-18 DIAGNOSIS — O358XX0 Maternal care for other (suspected) fetal abnormality and damage, not applicable or unspecified: Secondary | ICD-10-CM | POA: Diagnosis not present

## 2023-03-22 ENCOUNTER — Other Ambulatory Visit: Payer: Self-pay

## 2023-03-22 ENCOUNTER — Other Ambulatory Visit (HOSPITAL_COMMUNITY): Payer: Self-pay

## 2023-03-24 ENCOUNTER — Other Ambulatory Visit (HOSPITAL_COMMUNITY): Payer: Self-pay

## 2023-03-25 ENCOUNTER — Other Ambulatory Visit: Payer: Self-pay

## 2023-03-28 ENCOUNTER — Other Ambulatory Visit (HOSPITAL_COMMUNITY): Payer: Self-pay

## 2023-04-10 DIAGNOSIS — Z3A26 26 weeks gestation of pregnancy: Secondary | ICD-10-CM | POA: Diagnosis not present

## 2023-04-10 DIAGNOSIS — O99892 Other specified diseases and conditions complicating childbirth: Secondary | ICD-10-CM | POA: Diagnosis not present

## 2023-04-10 DIAGNOSIS — O99612 Diseases of the digestive system complicating pregnancy, second trimester: Secondary | ICD-10-CM | POA: Diagnosis not present

## 2023-04-10 DIAGNOSIS — O09522 Supervision of elderly multigravida, second trimester: Secondary | ICD-10-CM | POA: Diagnosis not present

## 2023-04-10 DIAGNOSIS — K219 Gastro-esophageal reflux disease without esophagitis: Secondary | ICD-10-CM | POA: Diagnosis not present

## 2023-04-10 DIAGNOSIS — R059 Cough, unspecified: Secondary | ICD-10-CM | POA: Diagnosis not present

## 2023-04-14 ENCOUNTER — Other Ambulatory Visit (HOSPITAL_COMMUNITY): Payer: Self-pay

## 2023-04-22 DIAGNOSIS — M349 Systemic sclerosis, unspecified: Secondary | ICD-10-CM | POA: Diagnosis not present

## 2023-04-22 DIAGNOSIS — O99282 Endocrine, nutritional and metabolic diseases complicating pregnancy, second trimester: Secondary | ICD-10-CM | POA: Diagnosis not present

## 2023-04-22 DIAGNOSIS — Z23 Encounter for immunization: Secondary | ICD-10-CM | POA: Diagnosis not present

## 2023-04-22 DIAGNOSIS — E079 Disorder of thyroid, unspecified: Secondary | ICD-10-CM | POA: Diagnosis not present

## 2023-04-22 DIAGNOSIS — O09523 Supervision of elderly multigravida, third trimester: Secondary | ICD-10-CM | POA: Diagnosis not present

## 2023-04-22 DIAGNOSIS — Z6791 Unspecified blood type, Rh negative: Secondary | ICD-10-CM | POA: Diagnosis not present

## 2023-04-22 DIAGNOSIS — O26892 Other specified pregnancy related conditions, second trimester: Secondary | ICD-10-CM | POA: Diagnosis not present

## 2023-04-22 DIAGNOSIS — O0993 Supervision of high risk pregnancy, unspecified, third trimester: Secondary | ICD-10-CM | POA: Diagnosis not present

## 2023-04-22 DIAGNOSIS — O0992 Supervision of high risk pregnancy, unspecified, second trimester: Secondary | ICD-10-CM | POA: Diagnosis not present

## 2023-04-22 DIAGNOSIS — O09819 Supervision of pregnancy resulting from assisted reproductive technology, unspecified trimester: Secondary | ICD-10-CM | POA: Diagnosis not present

## 2023-04-22 DIAGNOSIS — O26899 Other specified pregnancy related conditions, unspecified trimester: Secondary | ICD-10-CM | POA: Diagnosis not present

## 2023-04-22 DIAGNOSIS — Z3A28 28 weeks gestation of pregnancy: Secondary | ICD-10-CM | POA: Diagnosis not present

## 2023-04-26 DIAGNOSIS — D509 Iron deficiency anemia, unspecified: Secondary | ICD-10-CM | POA: Diagnosis not present

## 2023-05-12 ENCOUNTER — Other Ambulatory Visit (HOSPITAL_COMMUNITY): Payer: Self-pay

## 2023-05-13 ENCOUNTER — Other Ambulatory Visit (HOSPITAL_COMMUNITY): Payer: Self-pay

## 2023-05-13 DIAGNOSIS — Z3689 Encounter for other specified antenatal screening: Secondary | ICD-10-CM | POA: Diagnosis not present

## 2023-05-13 DIAGNOSIS — Z6791 Unspecified blood type, Rh negative: Secondary | ICD-10-CM | POA: Diagnosis not present

## 2023-05-13 DIAGNOSIS — O09819 Supervision of pregnancy resulting from assisted reproductive technology, unspecified trimester: Secondary | ICD-10-CM | POA: Diagnosis not present

## 2023-05-13 DIAGNOSIS — O4443 Low lying placenta NOS or without hemorrhage, third trimester: Secondary | ICD-10-CM | POA: Diagnosis not present

## 2023-05-13 DIAGNOSIS — O43219 Placenta accreta, unspecified trimester: Secondary | ICD-10-CM | POA: Diagnosis not present

## 2023-05-13 DIAGNOSIS — O09813 Supervision of pregnancy resulting from assisted reproductive technology, third trimester: Secondary | ICD-10-CM | POA: Diagnosis not present

## 2023-05-13 DIAGNOSIS — Z3A31 31 weeks gestation of pregnancy: Secondary | ICD-10-CM | POA: Diagnosis not present

## 2023-05-13 DIAGNOSIS — O99280 Endocrine, nutritional and metabolic diseases complicating pregnancy, unspecified trimester: Secondary | ICD-10-CM | POA: Diagnosis not present

## 2023-05-13 DIAGNOSIS — O4442 Low lying placenta NOS or without hemorrhage, second trimester: Secondary | ICD-10-CM | POA: Diagnosis not present

## 2023-05-13 DIAGNOSIS — E079 Disorder of thyroid, unspecified: Secondary | ICD-10-CM | POA: Diagnosis not present

## 2023-05-13 DIAGNOSIS — O26892 Other specified pregnancy related conditions, second trimester: Secondary | ICD-10-CM | POA: Diagnosis not present

## 2023-05-13 DIAGNOSIS — O0992 Supervision of high risk pregnancy, unspecified, second trimester: Secondary | ICD-10-CM | POA: Diagnosis not present

## 2023-05-13 DIAGNOSIS — M349 Systemic sclerosis, unspecified: Secondary | ICD-10-CM | POA: Diagnosis not present

## 2023-05-13 DIAGNOSIS — O09523 Supervision of elderly multigravida, third trimester: Secondary | ICD-10-CM | POA: Diagnosis not present

## 2023-05-13 MED ORDER — ONDANSETRON HCL 4 MG PO TABS
ORAL_TABLET | ORAL | 3 refills | Status: AC
  Filled 2023-05-13: qty 30, 10d supply, fill #0

## 2023-05-13 MED ORDER — LEVOTHYROXINE SODIUM 125 MCG PO TABS
ORAL_TABLET | ORAL | 11 refills | Status: AC
  Filled 2023-05-13: qty 24, 84d supply, fill #0

## 2023-05-16 ENCOUNTER — Other Ambulatory Visit (HOSPITAL_COMMUNITY): Payer: Self-pay

## 2023-05-22 DIAGNOSIS — D509 Iron deficiency anemia, unspecified: Secondary | ICD-10-CM | POA: Diagnosis not present

## 2023-05-22 DIAGNOSIS — O99013 Anemia complicating pregnancy, third trimester: Secondary | ICD-10-CM | POA: Diagnosis not present

## 2023-05-26 ENCOUNTER — Other Ambulatory Visit (HOSPITAL_COMMUNITY): Payer: Self-pay

## 2023-05-26 DIAGNOSIS — O99891 Other specified diseases and conditions complicating pregnancy: Secondary | ICD-10-CM | POA: Diagnosis not present

## 2023-05-26 DIAGNOSIS — R252 Cramp and spasm: Secondary | ICD-10-CM | POA: Diagnosis not present

## 2023-05-26 DIAGNOSIS — Z9229 Personal history of other drug therapy: Secondary | ICD-10-CM | POA: Diagnosis not present

## 2023-05-26 DIAGNOSIS — O09523 Supervision of elderly multigravida, third trimester: Secondary | ICD-10-CM | POA: Diagnosis not present

## 2023-05-26 DIAGNOSIS — D509 Iron deficiency anemia, unspecified: Secondary | ICD-10-CM | POA: Diagnosis not present

## 2023-05-26 DIAGNOSIS — O09813 Supervision of pregnancy resulting from assisted reproductive technology, third trimester: Secondary | ICD-10-CM | POA: Diagnosis not present

## 2023-05-26 DIAGNOSIS — O99283 Endocrine, nutritional and metabolic diseases complicating pregnancy, third trimester: Secondary | ICD-10-CM | POA: Diagnosis not present

## 2023-05-26 DIAGNOSIS — E079 Disorder of thyroid, unspecified: Secondary | ICD-10-CM | POA: Diagnosis not present

## 2023-05-26 DIAGNOSIS — O99013 Anemia complicating pregnancy, third trimester: Secondary | ICD-10-CM | POA: Diagnosis not present

## 2023-05-26 DIAGNOSIS — Z23 Encounter for immunization: Secondary | ICD-10-CM | POA: Diagnosis not present

## 2023-05-26 DIAGNOSIS — M349 Systemic sclerosis, unspecified: Secondary | ICD-10-CM | POA: Diagnosis not present

## 2023-05-26 DIAGNOSIS — N7689 Other specified inflammation of vagina and vulva: Secondary | ICD-10-CM | POA: Diagnosis not present

## 2023-05-26 MED ORDER — ESOMEPRAZOLE MAGNESIUM 40 MG PO CPDR
40.0000 mg | DELAYED_RELEASE_CAPSULE | Freq: Two times a day (BID) | ORAL | 0 refills | Status: DC
  Filled 2023-05-26: qty 60, 30d supply, fill #0

## 2023-05-27 DIAGNOSIS — O26643 Intrahepatic cholestasis of pregnancy, third trimester: Secondary | ICD-10-CM | POA: Diagnosis not present

## 2023-05-27 DIAGNOSIS — Z3A34 34 weeks gestation of pregnancy: Secondary | ICD-10-CM | POA: Diagnosis not present

## 2023-05-27 DIAGNOSIS — O99283 Endocrine, nutritional and metabolic diseases complicating pregnancy, third trimester: Secondary | ICD-10-CM | POA: Diagnosis not present

## 2023-05-27 DIAGNOSIS — O26893 Other specified pregnancy related conditions, third trimester: Secondary | ICD-10-CM | POA: Diagnosis not present

## 2023-05-27 DIAGNOSIS — Z3A36 36 weeks gestation of pregnancy: Secondary | ICD-10-CM | POA: Diagnosis not present

## 2023-05-27 DIAGNOSIS — R7401 Elevation of levels of liver transaminase levels: Secondary | ICD-10-CM | POA: Diagnosis not present

## 2023-05-27 DIAGNOSIS — E876 Hypokalemia: Secondary | ICD-10-CM | POA: Diagnosis not present

## 2023-05-27 DIAGNOSIS — O09523 Supervision of elderly multigravida, third trimester: Secondary | ICD-10-CM | POA: Diagnosis not present

## 2023-05-27 DIAGNOSIS — E890 Postprocedural hypothyroidism: Secondary | ICD-10-CM | POA: Diagnosis not present

## 2023-05-27 DIAGNOSIS — M349 Systemic sclerosis, unspecified: Secondary | ICD-10-CM | POA: Diagnosis not present

## 2023-05-27 DIAGNOSIS — O26613 Liver and biliary tract disorders in pregnancy, third trimester: Secondary | ICD-10-CM | POA: Diagnosis not present

## 2023-05-27 DIAGNOSIS — O99613 Diseases of the digestive system complicating pregnancy, third trimester: Secondary | ICD-10-CM | POA: Diagnosis not present

## 2023-05-27 DIAGNOSIS — O09813 Supervision of pregnancy resulting from assisted reproductive technology, third trimester: Secondary | ICD-10-CM | POA: Diagnosis not present

## 2023-05-27 DIAGNOSIS — O99891 Other specified diseases and conditions complicating pregnancy: Secondary | ICD-10-CM | POA: Diagnosis not present

## 2023-05-27 DIAGNOSIS — Z91041 Radiographic dye allergy status: Secondary | ICD-10-CM | POA: Diagnosis not present

## 2023-05-27 DIAGNOSIS — Z3A33 33 weeks gestation of pregnancy: Secondary | ICD-10-CM | POA: Diagnosis not present

## 2023-05-27 DIAGNOSIS — K219 Gastro-esophageal reflux disease without esophagitis: Secondary | ICD-10-CM | POA: Diagnosis not present

## 2023-05-27 DIAGNOSIS — Z6791 Unspecified blood type, Rh negative: Secondary | ICD-10-CM | POA: Diagnosis not present

## 2023-05-27 DIAGNOSIS — R0602 Shortness of breath: Secondary | ICD-10-CM | POA: Diagnosis not present

## 2023-05-27 DIAGNOSIS — O26892 Other specified pregnancy related conditions, second trimester: Secondary | ICD-10-CM | POA: Diagnosis not present

## 2023-05-27 DIAGNOSIS — Z1152 Encounter for screening for COVID-19: Secondary | ICD-10-CM | POA: Diagnosis not present

## 2023-05-27 DIAGNOSIS — R7989 Other specified abnormal findings of blood chemistry: Secondary | ICD-10-CM | POA: Diagnosis not present

## 2023-05-27 DIAGNOSIS — R748 Abnormal levels of other serum enzymes: Secondary | ICD-10-CM | POA: Diagnosis not present

## 2023-05-27 DIAGNOSIS — Z8639 Personal history of other endocrine, nutritional and metabolic disease: Secondary | ICD-10-CM | POA: Diagnosis not present

## 2023-05-31 DIAGNOSIS — O26643 Intrahepatic cholestasis of pregnancy, third trimester: Secondary | ICD-10-CM | POA: Diagnosis not present

## 2023-06-02 DIAGNOSIS — O26643 Intrahepatic cholestasis of pregnancy, third trimester: Secondary | ICD-10-CM | POA: Diagnosis not present

## 2023-06-02 DIAGNOSIS — O09513 Supervision of elderly primigravida, third trimester: Secondary | ICD-10-CM | POA: Diagnosis not present

## 2023-06-02 DIAGNOSIS — O43219 Placenta accreta, unspecified trimester: Secondary | ICD-10-CM | POA: Diagnosis not present

## 2023-06-02 DIAGNOSIS — Z3A34 34 weeks gestation of pregnancy: Secondary | ICD-10-CM | POA: Diagnosis not present

## 2023-06-02 DIAGNOSIS — O43213 Placenta accreta, third trimester: Secondary | ICD-10-CM | POA: Diagnosis not present

## 2023-06-02 DIAGNOSIS — O0993 Supervision of high risk pregnancy, unspecified, third trimester: Secondary | ICD-10-CM | POA: Diagnosis not present

## 2023-06-02 DIAGNOSIS — O09523 Supervision of elderly multigravida, third trimester: Secondary | ICD-10-CM | POA: Diagnosis not present

## 2023-06-09 DIAGNOSIS — O99013 Anemia complicating pregnancy, third trimester: Secondary | ICD-10-CM | POA: Diagnosis not present

## 2023-06-09 DIAGNOSIS — O26643 Intrahepatic cholestasis of pregnancy, third trimester: Secondary | ICD-10-CM | POA: Diagnosis not present

## 2023-06-09 DIAGNOSIS — E079 Disorder of thyroid, unspecified: Secondary | ICD-10-CM | POA: Diagnosis not present

## 2023-06-09 DIAGNOSIS — D509 Iron deficiency anemia, unspecified: Secondary | ICD-10-CM | POA: Diagnosis not present

## 2023-06-09 DIAGNOSIS — O26893 Other specified pregnancy related conditions, third trimester: Secondary | ICD-10-CM | POA: Diagnosis not present

## 2023-06-09 DIAGNOSIS — R748 Abnormal levels of other serum enzymes: Secondary | ICD-10-CM | POA: Diagnosis not present

## 2023-06-09 DIAGNOSIS — O43213 Placenta accreta, third trimester: Secondary | ICD-10-CM | POA: Diagnosis not present

## 2023-06-09 DIAGNOSIS — Z3A35 35 weeks gestation of pregnancy: Secondary | ICD-10-CM | POA: Diagnosis not present

## 2023-06-09 DIAGNOSIS — O99613 Diseases of the digestive system complicating pregnancy, third trimester: Secondary | ICD-10-CM | POA: Diagnosis not present

## 2023-06-09 DIAGNOSIS — M349 Systemic sclerosis, unspecified: Secondary | ICD-10-CM | POA: Diagnosis not present

## 2023-06-16 DIAGNOSIS — O43219 Placenta accreta, unspecified trimester: Secondary | ICD-10-CM | POA: Diagnosis not present

## 2023-06-16 DIAGNOSIS — O09513 Supervision of elderly primigravida, third trimester: Secondary | ICD-10-CM | POA: Diagnosis not present

## 2023-06-16 DIAGNOSIS — Z3689 Encounter for other specified antenatal screening: Secondary | ICD-10-CM | POA: Diagnosis not present

## 2023-06-16 DIAGNOSIS — O26893 Other specified pregnancy related conditions, third trimester: Secondary | ICD-10-CM | POA: Diagnosis not present

## 2023-06-16 DIAGNOSIS — O26649 Intrahepatic cholestasis of pregnancy, unspecified trimester: Secondary | ICD-10-CM | POA: Diagnosis not present

## 2023-06-16 DIAGNOSIS — R748 Abnormal levels of other serum enzymes: Secondary | ICD-10-CM | POA: Diagnosis not present

## 2023-06-16 DIAGNOSIS — K219 Gastro-esophageal reflux disease without esophagitis: Secondary | ICD-10-CM | POA: Diagnosis not present

## 2023-06-16 DIAGNOSIS — O09813 Supervision of pregnancy resulting from assisted reproductive technology, third trimester: Secondary | ICD-10-CM | POA: Diagnosis not present

## 2023-06-16 DIAGNOSIS — Z6791 Unspecified blood type, Rh negative: Secondary | ICD-10-CM | POA: Diagnosis not present

## 2023-06-16 DIAGNOSIS — D509 Iron deficiency anemia, unspecified: Secondary | ICD-10-CM | POA: Diagnosis not present

## 2023-06-16 DIAGNOSIS — O99891 Other specified diseases and conditions complicating pregnancy: Secondary | ICD-10-CM | POA: Diagnosis not present

## 2023-06-16 DIAGNOSIS — O99013 Anemia complicating pregnancy, third trimester: Secondary | ICD-10-CM | POA: Diagnosis not present

## 2023-06-16 DIAGNOSIS — O26643 Intrahepatic cholestasis of pregnancy, third trimester: Secondary | ICD-10-CM | POA: Diagnosis not present

## 2023-06-16 DIAGNOSIS — O09523 Supervision of elderly multigravida, third trimester: Secondary | ICD-10-CM | POA: Diagnosis not present

## 2023-06-16 DIAGNOSIS — O99613 Diseases of the digestive system complicating pregnancy, third trimester: Secondary | ICD-10-CM | POA: Diagnosis not present

## 2023-06-16 DIAGNOSIS — Z3A36 36 weeks gestation of pregnancy: Secondary | ICD-10-CM | POA: Diagnosis not present

## 2023-06-21 DIAGNOSIS — O0993 Supervision of high risk pregnancy, unspecified, third trimester: Secondary | ICD-10-CM | POA: Diagnosis not present

## 2023-06-21 DIAGNOSIS — O26643 Intrahepatic cholestasis of pregnancy, third trimester: Secondary | ICD-10-CM | POA: Diagnosis not present

## 2023-06-21 DIAGNOSIS — O26649 Intrahepatic cholestasis of pregnancy, unspecified trimester: Secondary | ICD-10-CM | POA: Diagnosis not present

## 2023-06-24 DIAGNOSIS — O09513 Supervision of elderly primigravida, third trimester: Secondary | ICD-10-CM | POA: Diagnosis not present

## 2023-06-24 DIAGNOSIS — E039 Hypothyroidism, unspecified: Secondary | ICD-10-CM | POA: Diagnosis not present

## 2023-06-24 DIAGNOSIS — R748 Abnormal levels of other serum enzymes: Secondary | ICD-10-CM | POA: Diagnosis not present

## 2023-06-24 DIAGNOSIS — O43219 Placenta accreta, unspecified trimester: Secondary | ICD-10-CM | POA: Diagnosis not present

## 2023-06-24 DIAGNOSIS — K831 Obstruction of bile duct: Secondary | ICD-10-CM | POA: Diagnosis not present

## 2023-06-24 DIAGNOSIS — Z9889 Other specified postprocedural states: Secondary | ICD-10-CM | POA: Diagnosis not present

## 2023-06-24 DIAGNOSIS — M349 Systemic sclerosis, unspecified: Secondary | ICD-10-CM | POA: Diagnosis not present

## 2023-06-24 DIAGNOSIS — O26649 Intrahepatic cholestasis of pregnancy, unspecified trimester: Secondary | ICD-10-CM | POA: Diagnosis not present

## 2023-06-24 DIAGNOSIS — D509 Iron deficiency anemia, unspecified: Secondary | ICD-10-CM | POA: Diagnosis not present

## 2023-06-24 DIAGNOSIS — O09819 Supervision of pregnancy resulting from assisted reproductive technology, unspecified trimester: Secondary | ICD-10-CM | POA: Diagnosis not present

## 2023-06-24 DIAGNOSIS — O26643 Intrahepatic cholestasis of pregnancy, third trimester: Secondary | ICD-10-CM | POA: Diagnosis not present

## 2023-06-24 DIAGNOSIS — O99013 Anemia complicating pregnancy, third trimester: Secondary | ICD-10-CM | POA: Diagnosis not present

## 2023-06-24 DIAGNOSIS — Z3689 Encounter for other specified antenatal screening: Secondary | ICD-10-CM | POA: Diagnosis not present

## 2023-06-24 DIAGNOSIS — O99283 Endocrine, nutritional and metabolic diseases complicating pregnancy, third trimester: Secondary | ICD-10-CM | POA: Diagnosis not present

## 2023-06-24 DIAGNOSIS — O360130 Maternal care for anti-D [Rh] antibodies, third trimester, not applicable or unspecified: Secondary | ICD-10-CM | POA: Diagnosis not present

## 2023-06-24 DIAGNOSIS — O09523 Supervision of elderly multigravida, third trimester: Secondary | ICD-10-CM | POA: Diagnosis not present

## 2023-06-24 DIAGNOSIS — Z3A37 37 weeks gestation of pregnancy: Secondary | ICD-10-CM | POA: Diagnosis not present

## 2023-06-24 DIAGNOSIS — O0993 Supervision of high risk pregnancy, unspecified, third trimester: Secondary | ICD-10-CM | POA: Diagnosis not present

## 2023-06-25 DIAGNOSIS — O26643 Intrahepatic cholestasis of pregnancy, third trimester: Secondary | ICD-10-CM | POA: Diagnosis not present

## 2023-06-25 DIAGNOSIS — E039 Hypothyroidism, unspecified: Secondary | ICD-10-CM | POA: Diagnosis not present

## 2023-06-25 DIAGNOSIS — O4443 Low lying placenta NOS or without hemorrhage, third trimester: Secondary | ICD-10-CM | POA: Diagnosis not present

## 2023-06-25 DIAGNOSIS — O09523 Supervision of elderly multigravida, third trimester: Secondary | ICD-10-CM | POA: Diagnosis not present

## 2023-06-25 DIAGNOSIS — O9972 Diseases of the skin and subcutaneous tissue complicating childbirth: Secondary | ICD-10-CM | POA: Diagnosis not present

## 2023-06-25 DIAGNOSIS — R609 Edema, unspecified: Secondary | ICD-10-CM | POA: Diagnosis not present

## 2023-06-25 DIAGNOSIS — O99285 Endocrine, nutritional and metabolic diseases complicating the puerperium: Secondary | ICD-10-CM | POA: Diagnosis not present

## 2023-06-25 DIAGNOSIS — O99284 Endocrine, nutritional and metabolic diseases complicating childbirth: Secondary | ICD-10-CM | POA: Diagnosis not present

## 2023-06-25 DIAGNOSIS — O43213 Placenta accreta, third trimester: Secondary | ICD-10-CM | POA: Diagnosis not present

## 2023-06-25 DIAGNOSIS — Z3A37 37 weeks gestation of pregnancy: Secondary | ICD-10-CM | POA: Diagnosis not present

## 2023-06-25 DIAGNOSIS — O9962 Diseases of the digestive system complicating childbirth: Secondary | ICD-10-CM | POA: Diagnosis not present

## 2023-06-25 DIAGNOSIS — K831 Obstruction of bile duct: Secondary | ICD-10-CM | POA: Diagnosis not present

## 2023-06-25 DIAGNOSIS — D509 Iron deficiency anemia, unspecified: Secondary | ICD-10-CM | POA: Diagnosis not present

## 2023-06-25 DIAGNOSIS — O9903 Anemia complicating the puerperium: Secondary | ICD-10-CM | POA: Diagnosis not present

## 2023-06-25 DIAGNOSIS — O43219 Placenta accreta, unspecified trimester: Secondary | ICD-10-CM | POA: Diagnosis not present

## 2023-06-25 DIAGNOSIS — Z9229 Personal history of other drug therapy: Secondary | ICD-10-CM | POA: Diagnosis not present

## 2023-06-25 DIAGNOSIS — E079 Disorder of thyroid, unspecified: Secondary | ICD-10-CM | POA: Diagnosis not present

## 2023-06-25 DIAGNOSIS — O99893 Other specified diseases and conditions complicating puerperium: Secondary | ICD-10-CM | POA: Diagnosis not present

## 2023-06-25 DIAGNOSIS — O34211 Maternal care for low transverse scar from previous cesarean delivery: Secondary | ICD-10-CM | POA: Diagnosis not present

## 2023-06-25 DIAGNOSIS — O9902 Anemia complicating childbirth: Secondary | ICD-10-CM | POA: Diagnosis not present

## 2023-06-25 DIAGNOSIS — O1204 Gestational edema, complicating childbirth: Secondary | ICD-10-CM | POA: Diagnosis not present

## 2023-07-06 DIAGNOSIS — L940 Localized scleroderma [morphea]: Secondary | ICD-10-CM | POA: Diagnosis not present

## 2023-07-06 DIAGNOSIS — K219 Gastro-esophageal reflux disease without esophagitis: Secondary | ICD-10-CM | POA: Diagnosis not present

## 2023-07-06 DIAGNOSIS — R748 Abnormal levels of other serum enzymes: Secondary | ICD-10-CM | POA: Diagnosis not present

## 2023-07-06 DIAGNOSIS — R7303 Prediabetes: Secondary | ICD-10-CM | POA: Diagnosis not present

## 2023-07-06 DIAGNOSIS — E890 Postprocedural hypothyroidism: Secondary | ICD-10-CM | POA: Diagnosis not present

## 2023-07-06 DIAGNOSIS — K7689 Other specified diseases of liver: Secondary | ICD-10-CM | POA: Diagnosis not present

## 2023-07-12 ENCOUNTER — Other Ambulatory Visit (HOSPITAL_COMMUNITY): Payer: Self-pay

## 2023-07-12 DIAGNOSIS — B3731 Acute candidiasis of vulva and vagina: Secondary | ICD-10-CM | POA: Diagnosis not present

## 2023-07-12 DIAGNOSIS — L231 Allergic contact dermatitis due to adhesives: Secondary | ICD-10-CM | POA: Diagnosis not present

## 2023-07-12 DIAGNOSIS — R300 Dysuria: Secondary | ICD-10-CM | POA: Diagnosis not present

## 2023-07-12 MED ORDER — TRIAMCINOLONE ACETONIDE 0.5 % EX CREA
TOPICAL_CREAM | Freq: Two times a day (BID) | CUTANEOUS | 0 refills | Status: AC
  Filled 2023-07-12: qty 15, 7d supply, fill #0

## 2023-07-12 MED ORDER — FLUCONAZOLE 150 MG PO TABS
150.0000 mg | ORAL_TABLET | ORAL | 0 refills | Status: AC
  Filled 2023-07-12: qty 2, 6d supply, fill #0

## 2023-07-13 DIAGNOSIS — M6281 Muscle weakness (generalized): Secondary | ICD-10-CM | POA: Diagnosis not present

## 2023-07-13 DIAGNOSIS — R102 Pelvic and perineal pain: Secondary | ICD-10-CM | POA: Diagnosis not present

## 2023-07-13 DIAGNOSIS — R29898 Other symptoms and signs involving the musculoskeletal system: Secondary | ICD-10-CM | POA: Diagnosis not present

## 2023-08-05 ENCOUNTER — Other Ambulatory Visit (HOSPITAL_COMMUNITY): Payer: Self-pay

## 2023-08-05 MED ORDER — ESTRADIOL 0.1 MG/GM VA CREA
TOPICAL_CREAM | VAGINAL | 2 refills | Status: AC
  Filled 2023-08-05: qty 42.5, 90d supply, fill #0

## 2023-08-08 ENCOUNTER — Other Ambulatory Visit (HOSPITAL_COMMUNITY): Payer: Self-pay

## 2023-08-08 MED ORDER — LEVOTHYROXINE SODIUM 150 MCG PO TABS
ORAL_TABLET | ORAL | 3 refills | Status: AC
  Filled 2023-08-08: qty 60, 84d supply, fill #0
  Filled 2023-11-01: qty 60, 84d supply, fill #1
  Filled 2024-01-17: qty 60, 84d supply, fill #2
  Filled 2024-04-03: qty 60, 84d supply, fill #3

## 2023-08-08 MED ORDER — LEVOTHYROXINE SODIUM 125 MCG PO TABS
ORAL_TABLET | ORAL | 3 refills | Status: AC
  Filled 2023-08-08: qty 24, 84d supply, fill #0
  Filled 2023-12-30: qty 24, 84d supply, fill #1
  Filled 2024-05-14: qty 24, 84d supply, fill #2

## 2023-09-20 ENCOUNTER — Other Ambulatory Visit (HOSPITAL_COMMUNITY): Payer: Self-pay

## 2023-09-20 MED ORDER — VALACYCLOVIR HCL 1 G PO TABS
2000.0000 mg | ORAL_TABLET | ORAL | 0 refills | Status: AC
  Filled 2023-09-20: qty 6, 3d supply, fill #0

## 2023-09-21 ENCOUNTER — Other Ambulatory Visit (HOSPITAL_COMMUNITY): Payer: Self-pay

## 2023-10-12 DIAGNOSIS — R7303 Prediabetes: Secondary | ICD-10-CM | POA: Diagnosis not present

## 2023-10-12 DIAGNOSIS — E890 Postprocedural hypothyroidism: Secondary | ICD-10-CM | POA: Diagnosis not present

## 2023-10-12 DIAGNOSIS — L940 Localized scleroderma [morphea]: Secondary | ICD-10-CM | POA: Diagnosis not present

## 2023-10-12 DIAGNOSIS — K7689 Other specified diseases of liver: Secondary | ICD-10-CM | POA: Diagnosis not present

## 2023-10-12 DIAGNOSIS — K219 Gastro-esophageal reflux disease without esophagitis: Secondary | ICD-10-CM | POA: Diagnosis not present

## 2023-11-01 ENCOUNTER — Other Ambulatory Visit (HOSPITAL_COMMUNITY): Payer: Self-pay

## 2023-11-16 ENCOUNTER — Other Ambulatory Visit (HOSPITAL_COMMUNITY): Payer: Self-pay

## 2023-11-22 ENCOUNTER — Other Ambulatory Visit (HOSPITAL_COMMUNITY): Payer: Self-pay

## 2023-11-22 MED ORDER — VALACYCLOVIR HCL 1 G PO TABS
2000.0000 mg | ORAL_TABLET | ORAL | 0 refills | Status: AC
  Filled 2023-11-22: qty 6, 3d supply, fill #0

## 2023-11-23 ENCOUNTER — Other Ambulatory Visit (HOSPITAL_COMMUNITY): Payer: Self-pay

## 2023-12-01 ENCOUNTER — Other Ambulatory Visit (HOSPITAL_COMMUNITY): Payer: Self-pay

## 2023-12-30 ENCOUNTER — Other Ambulatory Visit: Payer: Self-pay

## 2024-01-05 ENCOUNTER — Other Ambulatory Visit (HOSPITAL_COMMUNITY): Payer: Self-pay

## 2024-02-17 ENCOUNTER — Other Ambulatory Visit (HOSPITAL_COMMUNITY): Payer: Self-pay

## 2024-02-17 MED ORDER — LIOTHYRONINE SODIUM 5 MCG PO TABS
20.0000 ug | ORAL_TABLET | Freq: Every day | ORAL | 12 refills | Status: AC
  Filled 2024-02-17: qty 360, 90d supply, fill #0
  Filled 2024-05-14: qty 360, 90d supply, fill #1

## 2024-04-19 DIAGNOSIS — R7303 Prediabetes: Secondary | ICD-10-CM | POA: Diagnosis not present

## 2024-04-19 DIAGNOSIS — Z23 Encounter for immunization: Secondary | ICD-10-CM | POA: Diagnosis not present

## 2024-04-19 DIAGNOSIS — L940 Localized scleroderma [morphea]: Secondary | ICD-10-CM | POA: Diagnosis not present

## 2024-04-19 DIAGNOSIS — K7689 Other specified diseases of liver: Secondary | ICD-10-CM | POA: Diagnosis not present

## 2024-04-19 DIAGNOSIS — E890 Postprocedural hypothyroidism: Secondary | ICD-10-CM | POA: Diagnosis not present

## 2024-04-19 DIAGNOSIS — K219 Gastro-esophageal reflux disease without esophagitis: Secondary | ICD-10-CM | POA: Diagnosis not present

## 2024-05-14 ENCOUNTER — Other Ambulatory Visit (HOSPITAL_COMMUNITY): Payer: Self-pay

## 2024-05-14 ENCOUNTER — Other Ambulatory Visit: Payer: Self-pay
